# Patient Record
Sex: Male | Born: 1965 | Race: White | Hispanic: No | Marital: Single | Smoking: Current every day smoker
Health system: Southern US, Community
[De-identification: ages and names within clinical notes are randomized; demographics above are authoritative.]

## PROBLEM LIST (undated history)

## (undated) DIAGNOSIS — Z87442 Personal history of urinary calculi: Secondary | ICD-10-CM

## (undated) DIAGNOSIS — K759 Inflammatory liver disease, unspecified: Secondary | ICD-10-CM

## (undated) HISTORY — PX: APPENDECTOMY: SHX54

## (undated) HISTORY — PX: KNEE SURGERY: SHX244

---

## 2008-11-04 ENCOUNTER — Emergency Department: Payer: Self-pay | Admitting: Unknown Physician Specialty

## 2009-01-26 ENCOUNTER — Emergency Department: Payer: Self-pay | Admitting: Emergency Medicine

## 2009-08-03 ENCOUNTER — Emergency Department: Payer: Self-pay | Admitting: Emergency Medicine

## 2009-11-08 ENCOUNTER — Emergency Department: Payer: Self-pay | Admitting: Unknown Physician Specialty

## 2010-02-04 ENCOUNTER — Emergency Department: Payer: Self-pay | Admitting: Emergency Medicine

## 2010-02-14 ENCOUNTER — Emergency Department: Payer: Self-pay | Admitting: Emergency Medicine

## 2010-06-14 ENCOUNTER — Emergency Department: Payer: Self-pay | Admitting: Emergency Medicine

## 2010-08-06 ENCOUNTER — Emergency Department: Payer: Self-pay | Admitting: Emergency Medicine

## 2010-08-19 ENCOUNTER — Emergency Department: Payer: Self-pay | Admitting: Emergency Medicine

## 2010-11-15 ENCOUNTER — Emergency Department: Payer: Self-pay | Admitting: Emergency Medicine

## 2010-11-19 ENCOUNTER — Emergency Department: Payer: Self-pay | Admitting: Emergency Medicine

## 2010-11-25 ENCOUNTER — Emergency Department: Payer: Self-pay | Admitting: Emergency Medicine

## 2010-11-26 ENCOUNTER — Emergency Department: Payer: Self-pay | Admitting: Unknown Physician Specialty

## 2011-01-15 ENCOUNTER — Emergency Department: Payer: Self-pay | Admitting: Emergency Medicine

## 2011-03-29 ENCOUNTER — Emergency Department: Payer: Self-pay | Admitting: *Deleted

## 2011-05-20 ENCOUNTER — Emergency Department: Payer: Self-pay | Admitting: Internal Medicine

## 2011-06-23 ENCOUNTER — Emergency Department: Payer: Self-pay | Admitting: Emergency Medicine

## 2011-06-23 LAB — URINALYSIS, COMPLETE
Bacteria: NONE SEEN
Blood: NEGATIVE
Leukocyte Esterase: NEGATIVE
Nitrite: NEGATIVE
Ph: 5 (ref 4.5–8.0)
Protein: NEGATIVE
Specific Gravity: 1.02 (ref 1.003–1.030)
WBC UR: <1 /HPF (ref 0–5)

## 2011-10-19 ENCOUNTER — Emergency Department: Payer: Self-pay | Admitting: Emergency Medicine

## 2011-11-24 ENCOUNTER — Emergency Department: Payer: Self-pay | Admitting: Emergency Medicine

## 2011-11-25 LAB — CBC
HCT: 47.9 % (ref 40.0–52.0)
HGB: 15.6 g/dL (ref 13.0–18.0)
MCH: 31.9 pg (ref 26.0–34.0)
MCHC: 32.5 g/dL (ref 32.0–36.0)
MCV: 98 fL (ref 80–100)
Platelet: 173 10*3/uL (ref 150–440)
RBC: 4.88 10*6/uL (ref 4.40–5.90)

## 2011-11-25 LAB — URINALYSIS, COMPLETE
Bacteria: NONE SEEN
Bilirubin,UR: NEGATIVE
Glucose,UR: NEGATIVE mg/dL (ref 0–75)
Leukocyte Esterase: NEGATIVE
Nitrite: NEGATIVE
Protein: NEGATIVE
Specific Gravity: 1.027 (ref 1.003–1.030)
WBC UR: 1 /HPF (ref 0–5)

## 2011-11-25 LAB — COMPREHENSIVE METABOLIC PANEL
Albumin: 4 g/dL (ref 3.4–5.0)
Anion Gap: 7 (ref 7–16)
BUN: 20 mg/dL — ABNORMAL HIGH (ref 7–18)
Bilirubin,Total: 0.4 mg/dL (ref 0.2–1.0)
Calcium, Total: 9.3 mg/dL (ref 8.5–10.1)
Chloride: 105 mmol/L (ref 98–107)
Co2: 27 mmol/L (ref 21–32)
Glucose: 98 mg/dL (ref 65–99)
Osmolality: 280 (ref 275–301)
Potassium: 4.5 mmol/L (ref 3.5–5.1)
SGOT(AST): 94 U/L — ABNORMAL HIGH (ref 15–37)
SGPT (ALT): 150 U/L — ABNORMAL HIGH
Sodium: 139 mmol/L (ref 136–145)

## 2011-11-25 LAB — LIPASE, BLOOD: Lipase: 146 U/L (ref 73–393)

## 2011-12-15 ENCOUNTER — Emergency Department: Payer: Self-pay | Admitting: Emergency Medicine

## 2011-12-27 ENCOUNTER — Emergency Department: Payer: Self-pay | Admitting: *Deleted

## 2012-01-10 ENCOUNTER — Emergency Department: Payer: Self-pay | Admitting: Emergency Medicine

## 2012-01-20 LAB — URINALYSIS, COMPLETE
Bilirubin,UR: NEGATIVE
Blood: NEGATIVE
Glucose,UR: NEGATIVE mg/dL (ref 0–75)
Hyaline Cast: 74
Ketone: NEGATIVE
Leukocyte Esterase: NEGATIVE
Ph: 5 (ref 4.5–8.0)
Protein: 30
Specific Gravity: 1.025 (ref 1.003–1.030)
Squamous Epithelial: 2

## 2012-01-20 LAB — COMPREHENSIVE METABOLIC PANEL
Albumin: 3.9 g/dL (ref 3.4–5.0)
Alkaline Phosphatase: 103 U/L (ref 50–136)
Anion Gap: 13 (ref 7–16)
BUN: 37 mg/dL — ABNORMAL HIGH (ref 7–18)
Bilirubin,Total: 0.4 mg/dL (ref 0.2–1.0)
Calcium, Total: 8.9 mg/dL (ref 8.5–10.1)
Chloride: 104 mmol/L (ref 98–107)
Creatinine: 2.49 mg/dL — ABNORMAL HIGH (ref 0.60–1.30)
Glucose: 167 mg/dL — ABNORMAL HIGH (ref 65–99)
Potassium: 4 mmol/L (ref 3.5–5.1)
SGOT(AST): 57 U/L — ABNORMAL HIGH (ref 15–37)
Sodium: 136 mmol/L (ref 136–145)
Total Protein: 8.8 g/dL — ABNORMAL HIGH (ref 6.4–8.2)

## 2012-01-20 LAB — CBC
HCT: 45.3 % (ref 40.0–52.0)
HGB: 14.6 g/dL (ref 13.0–18.0)
MCHC: 32.2 g/dL (ref 32.0–36.0)
Platelet: 194 10*3/uL (ref 150–440)
RDW: 14.4 % (ref 11.5–14.5)
WBC: 14.3 10*3/uL — ABNORMAL HIGH (ref 3.8–10.6)

## 2012-01-20 LAB — TROPONIN I: Troponin-I: <0.02 ng/mL

## 2012-01-21 ENCOUNTER — Inpatient Hospital Stay: Payer: Self-pay | Admitting: Specialist

## 2012-01-21 LAB — BASIC METABOLIC PANEL
Anion Gap: 19 — ABNORMAL HIGH (ref 7–16)
BUN: 37 mg/dL — ABNORMAL HIGH (ref 7–18)
Calcium, Total: 9.3 mg/dL (ref 8.5–10.1)
Chloride: 104 mmol/L (ref 98–107)
Co2: 15 mmol/L — ABNORMAL LOW (ref 21–32)
Creatinine: 2.57 mg/dL — ABNORMAL HIGH (ref 0.60–1.30)
EGFR (African American): 33 — ABNORMAL LOW
EGFR (Non-African Amer.): 28 — ABNORMAL LOW
Glucose: 136 mg/dL — ABNORMAL HIGH (ref 65–99)
Osmolality: 286 (ref 275–301)
Potassium: 4 mmol/L (ref 3.5–5.1)
Sodium: 138 mmol/L (ref 136–145)

## 2012-01-21 LAB — ACETAMINOPHEN LEVEL: Acetaminophen: <2 ug/mL

## 2012-01-21 LAB — SALICYLATE LEVEL: Salicylates, Serum: 9.9 mg/dL — ABNORMAL HIGH

## 2012-01-21 LAB — TROPONIN I
Troponin-I: <0.02 ng/mL
Troponin-I: <0.02 ng/mL

## 2012-01-21 LAB — TSH: Thyroid Stimulating Horm: 0.788 u[IU]/mL

## 2012-01-22 LAB — CBC WITH DIFFERENTIAL/PLATELET
Basophil #: 0 10*3/uL (ref 0.0–0.1)
Basophil %: 0.5 %
Eosinophil #: 0.2 10*3/uL (ref 0.0–0.7)
HCT: 37.6 % — ABNORMAL LOW (ref 40.0–52.0)
HGB: 12.6 g/dL — ABNORMAL LOW (ref 13.0–18.0)
Lymphocyte #: 3.2 10*3/uL (ref 1.0–3.6)
Lymphocyte %: 43.4 %
MCHC: 33.6 g/dL (ref 32.0–36.0)
MCV: 97 fL (ref 80–100)
Monocyte #: 0.5 x10 3/mm (ref 0.2–1.0)
Monocyte %: 7.1 %
Neutrophil #: 3.4 10*3/uL (ref 1.4–6.5)
RDW: 14 % (ref 11.5–14.5)

## 2012-01-22 LAB — COMPREHENSIVE METABOLIC PANEL
Anion Gap: 5 — ABNORMAL LOW (ref 7–16)
Bilirubin,Total: 0.2 mg/dL (ref 0.2–1.0)
Calcium, Total: 7.9 mg/dL — ABNORMAL LOW (ref 8.5–10.1)
Chloride: 113 mmol/L — ABNORMAL HIGH (ref 98–107)
Co2: 26 mmol/L (ref 21–32)
Creatinine: 0.91 mg/dL (ref 0.60–1.30)
EGFR (African American): >60
EGFR (Non-African Amer.): >60
Glucose: 98 mg/dL (ref 65–99)
Potassium: 4.5 mmol/L (ref 3.5–5.1)
SGOT(AST): 53 U/L — ABNORMAL HIGH (ref 15–37)
SGPT (ALT): 62 U/L (ref 12–78)
Sodium: 144 mmol/L (ref 136–145)
Total Protein: 5.9 g/dL — ABNORMAL LOW (ref 6.4–8.2)

## 2012-05-05 ENCOUNTER — Emergency Department: Payer: Self-pay | Admitting: Emergency Medicine

## 2012-06-25 ENCOUNTER — Emergency Department: Payer: Self-pay | Admitting: Emergency Medicine

## 2012-06-25 LAB — BASIC METABOLIC PANEL
Anion Gap: 7 (ref 7–16)
BUN: 14 mg/dL (ref 7–18)
Calcium, Total: 9 mg/dL (ref 8.5–10.1)
Co2: 25 mmol/L (ref 21–32)
Creatinine: 1.07 mg/dL (ref 0.60–1.30)
EGFR (African American): >60
Glucose: 165 mg/dL — ABNORMAL HIGH (ref 65–99)
Sodium: 135 mmol/L — ABNORMAL LOW (ref 136–145)

## 2012-06-25 LAB — URINALYSIS, COMPLETE
Bacteria: NONE SEEN
Blood: NEGATIVE
Ketone: NEGATIVE
Leukocyte Esterase: NEGATIVE
RBC,UR: 1 /HPF (ref 0–5)
Specific Gravity: 1.01 (ref 1.003–1.030)

## 2012-06-25 LAB — CBC
MCH: 32.4 pg (ref 26.0–34.0)
RBC: 4.86 10*6/uL (ref 4.40–5.90)
RDW: 13.6 % (ref 11.5–14.5)

## 2012-08-26 ENCOUNTER — Emergency Department: Payer: Self-pay | Admitting: Emergency Medicine

## 2012-08-28 ENCOUNTER — Emergency Department: Payer: Self-pay | Admitting: Emergency Medicine

## 2012-11-20 ENCOUNTER — Emergency Department: Payer: Self-pay | Admitting: Emergency Medicine

## 2012-11-20 LAB — COMPREHENSIVE METABOLIC PANEL
Anion Gap: 8 (ref 7–16)
BUN: 18 mg/dL (ref 7–18)
Calcium, Total: 9.2 mg/dL (ref 8.5–10.1)
Chloride: 105 mmol/L (ref 98–107)
Co2: 25 mmol/L (ref 21–32)
Creatinine: 1.07 mg/dL (ref 0.60–1.30)
EGFR (African American): >60
Glucose: 103 mg/dL — ABNORMAL HIGH (ref 65–99)
Osmolality: 278 (ref 275–301)
Potassium: 3.5 mmol/L (ref 3.5–5.1)
SGOT(AST): 67 U/L — ABNORMAL HIGH (ref 15–37)
SGPT (ALT): 98 U/L — ABNORMAL HIGH (ref 12–78)

## 2012-11-20 LAB — CBC
HCT: 46.9 % (ref 40.0–52.0)
MCH: 32.1 pg (ref 26.0–34.0)
MCHC: 33.8 g/dL (ref 32.0–36.0)
MCV: 95 fL (ref 80–100)
RBC: 4.94 10*6/uL (ref 4.40–5.90)
RDW: 14.1 % (ref 11.5–14.5)
WBC: 9.3 10*3/uL (ref 3.8–10.6)

## 2012-11-21 LAB — CK TOTAL AND CKMB (NOT AT ARMC): CK, Total: 74 U/L (ref 35–232)

## 2012-12-15 ENCOUNTER — Emergency Department: Payer: Self-pay | Admitting: Emergency Medicine

## 2012-12-20 ENCOUNTER — Emergency Department: Payer: Self-pay | Admitting: Emergency Medicine

## 2012-12-20 LAB — BASIC METABOLIC PANEL
Anion Gap: 5 — ABNORMAL LOW (ref 7–16)
Calcium, Total: 9.2 mg/dL (ref 8.5–10.1)
Creatinine: 0.89 mg/dL (ref 0.60–1.30)
EGFR (Non-African Amer.): >60
Glucose: 93 mg/dL (ref 65–99)
Osmolality: 279 (ref 275–301)
Potassium: 3.9 mmol/L (ref 3.5–5.1)
Sodium: 140 mmol/L (ref 136–145)

## 2012-12-20 LAB — URINALYSIS, COMPLETE
Bacteria: NONE SEEN
Ketone: NEGATIVE
Ph: 5 (ref 4.5–8.0)
RBC,UR: NONE SEEN /HPF (ref 0–5)

## 2012-12-20 LAB — CBC
MCV: 96 fL (ref 80–100)
RDW: 14.1 % (ref 11.5–14.5)
WBC: 6.6 10*3/uL (ref 3.8–10.6)

## 2014-02-22 ENCOUNTER — Emergency Department: Payer: Self-pay | Admitting: Emergency Medicine

## 2014-02-22 LAB — CBC WITH DIFFERENTIAL/PLATELET
BASOS ABS: 0 10*3/uL (ref 0.0–0.1)
BASOS PCT: 0.6 %
Eosinophil #: 0.2 10*3/uL (ref 0.0–0.7)
Eosinophil %: 4.2 %
HCT: 46.8 % (ref 40.0–52.0)
HGB: 15.1 g/dL (ref 13.0–18.0)
Lymphocyte #: 2.7 10*3/uL (ref 1.0–3.6)
Lymphocyte %: 45.5 %
MCH: 31.6 pg (ref 26.0–34.0)
MCHC: 32.3 g/dL (ref 32.0–36.0)
MCV: 98 fL (ref 80–100)
MONOS PCT: 8.2 %
Monocyte #: 0.5 x10 3/mm (ref 0.2–1.0)
Neutrophil #: 2.4 10*3/uL (ref 1.4–6.5)
Neutrophil %: 41.5 %
Platelet: 120 10*3/uL — ABNORMAL LOW (ref 150–440)
RBC: 4.79 10*6/uL (ref 4.40–5.90)
RDW: 13.2 % (ref 11.5–14.5)
WBC: 5.9 10*3/uL (ref 3.8–10.6)

## 2014-02-22 LAB — COMPREHENSIVE METABOLIC PANEL
ALBUMIN: 3.6 g/dL (ref 3.4–5.0)
ALK PHOS: 109 U/L
ALT: 164 U/L — AB
ANION GAP: 6 — AB (ref 7–16)
BUN: 16 mg/dL (ref 7–18)
Bilirubin,Total: 0.3 mg/dL (ref 0.2–1.0)
CO2: 27 mmol/L (ref 21–32)
Calcium, Total: 8.6 mg/dL (ref 8.5–10.1)
Chloride: 108 mmol/L — ABNORMAL HIGH (ref 98–107)
Creatinine: 0.9 mg/dL (ref 0.60–1.30)
GLUCOSE: 77 mg/dL (ref 65–99)
Osmolality: 281 (ref 275–301)
POTASSIUM: 3.9 mmol/L (ref 3.5–5.1)
SGOT(AST): 88 U/L — ABNORMAL HIGH (ref 15–37)
SODIUM: 141 mmol/L (ref 136–145)
TOTAL PROTEIN: 8.2 g/dL (ref 6.4–8.2)

## 2014-02-22 LAB — URINALYSIS, COMPLETE
BACTERIA: NONE SEEN
BILIRUBIN, UR: NEGATIVE
Blood: NEGATIVE
GLUCOSE, UR: NEGATIVE mg/dL (ref 0–75)
KETONE: NEGATIVE
LEUKOCYTE ESTERASE: NEGATIVE
Nitrite: NEGATIVE
PH: 6 (ref 4.5–8.0)
Protein: NEGATIVE
RBC,UR: 1 /HPF (ref 0–5)
SQUAMOUS EPITHELIAL: NONE SEEN
Specific Gravity: 1.004 (ref 1.003–1.030)
WBC UR: <1 /HPF (ref 0–5)

## 2014-02-22 LAB — LIPASE, BLOOD: Lipase: 190 U/L (ref 73–393)

## 2014-04-03 ENCOUNTER — Ambulatory Visit: Payer: Self-pay | Admitting: Gastroenterology

## 2014-04-30 IMAGING — CR RIGHT FOREARM - 2 VIEW
1 series · 2 of 2 positions shown · non-contrast
Comparison: none

REASON FOR EXAM: pain/edema 2nd to fall
COMMENTS:   May transport without cardiac monitor

PROCEDURE:     DXR - DXR FOREARM RIGHT  - October 19, 2011  [DATE]
RESULT:     No fracture, dislocation or other acute bony abnormality is
identified.

[Series 2: x forearm lat right · 0.14mm/px · 2 of 2 slices shown]
[im 1/2]
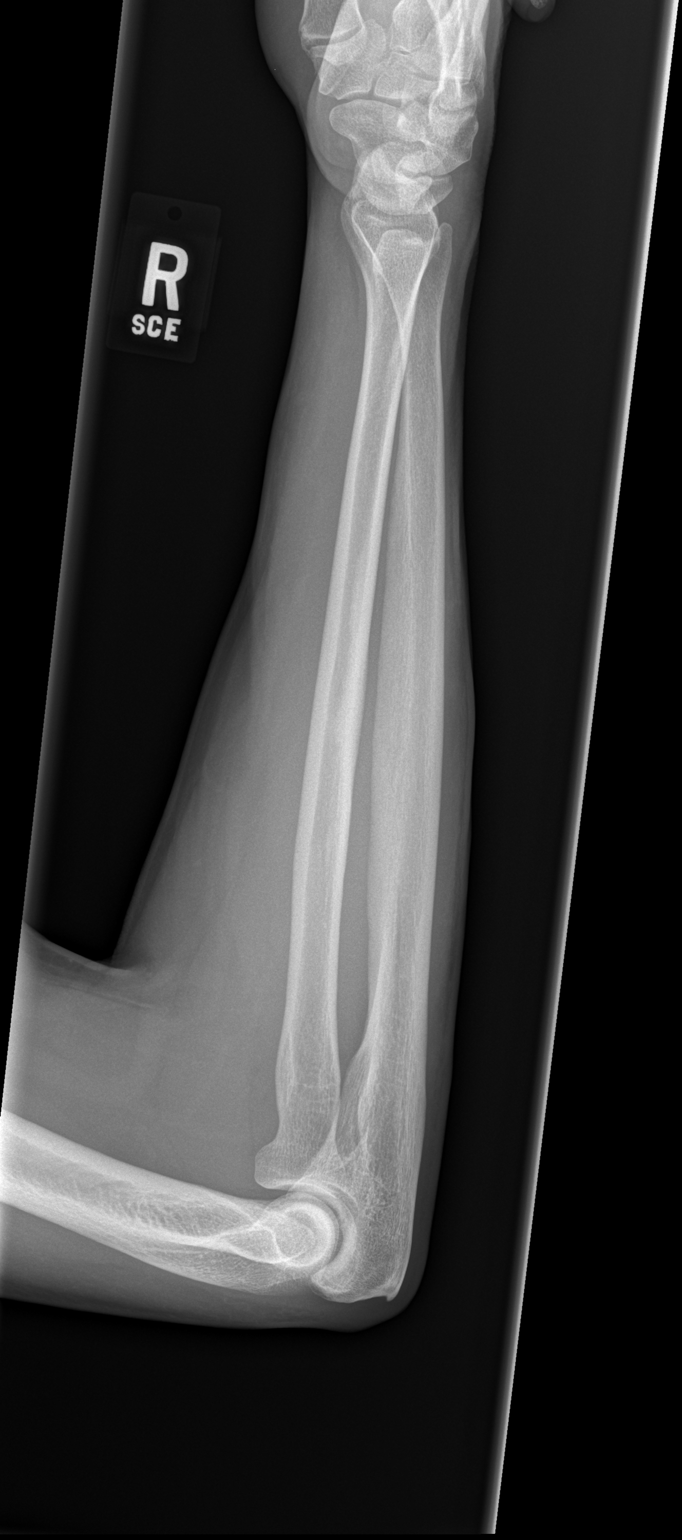
[im 2/2]
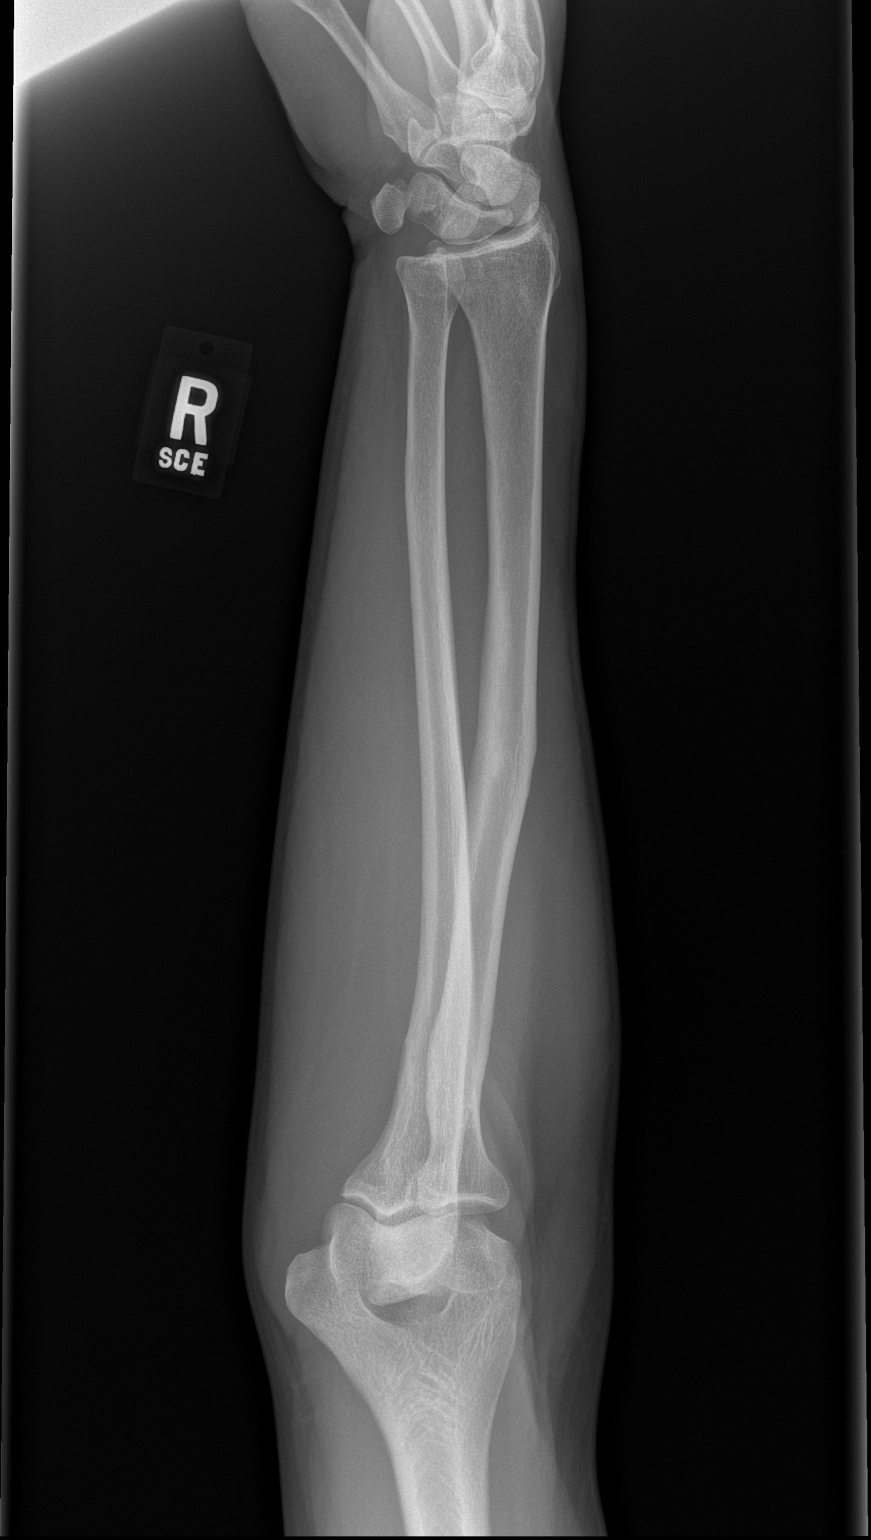

[2 of 2 positions shown; findings below may reference images not displayed]

IMPRESSION: 1.  No significant osseous abnormalities are identified.
2.  There is no elevation of the distal humeral fat pads.

[REDACTED]

## 2014-05-25 ENCOUNTER — Emergency Department: Payer: Self-pay | Admitting: Emergency Medicine

## 2014-05-25 LAB — URINALYSIS, COMPLETE
Bacteria: NONE SEEN
Bilirubin,UR: NEGATIVE
Blood: NEGATIVE
GLUCOSE, UR: NEGATIVE mg/dL (ref 0–75)
Hyaline Cast: 5
Ketone: NEGATIVE
Leukocyte Esterase: NEGATIVE
NITRITE: NEGATIVE
Ph: 5 (ref 4.5–8.0)
Protein: NEGATIVE
SPECIFIC GRAVITY: 1.02 (ref 1.003–1.030)
Squamous Epithelial: NONE SEEN

## 2014-05-25 LAB — CBC
HCT: 49.3 % (ref 40.0–52.0)
HGB: 16.3 g/dL (ref 13.0–18.0)
MCH: 32.3 pg (ref 26.0–34.0)
MCHC: 33.1 g/dL (ref 32.0–36.0)
MCV: 98 fL (ref 80–100)
Platelet: 167 10*3/uL (ref 150–440)
RBC: 5.05 10*6/uL (ref 4.40–5.90)
RDW: 13.7 % (ref 11.5–14.5)
WBC: 13.5 10*3/uL — ABNORMAL HIGH (ref 3.8–10.6)

## 2014-05-25 LAB — COMPREHENSIVE METABOLIC PANEL
Albumin: 3.7 g/dL (ref 3.4–5.0)
Alkaline Phosphatase: 77 U/L
Anion Gap: 9 (ref 7–16)
BILIRUBIN TOTAL: 0.5 mg/dL (ref 0.2–1.0)
BUN: 15 mg/dL (ref 7–18)
CALCIUM: 9.3 mg/dL (ref 8.5–10.1)
Chloride: 102 mmol/L (ref 98–107)
Co2: 29 mmol/L (ref 21–32)
Creatinine: 1.1 mg/dL (ref 0.60–1.30)
GLUCOSE: 89 mg/dL (ref 65–99)
OSMOLALITY: 280 (ref 275–301)
Potassium: 4.1 mmol/L (ref 3.5–5.1)
SGOT(AST): 40 U/L — ABNORMAL HIGH (ref 15–37)
SGPT (ALT): 44 U/L
SODIUM: 140 mmol/L (ref 136–145)
Total Protein: 8.6 g/dL — ABNORMAL HIGH (ref 6.4–8.2)

## 2014-05-25 LAB — LIPASE, BLOOD: Lipase: 77 U/L (ref 73–393)

## 2014-06-06 IMAGING — CT CT ABD-PELV W/O CM
1 of 2 series · 15 of 32 positions shown, 19 images · non-contrast
Comparison: 08/30/2010

REASON FOR EXAM: (1) RLQ/ right flank pain, nausea,; (2) RLQ/ right flank
pain, nausea,
COMMENTS:

PROCEDURE:     CT  - CT ABDOMEN AND PELVIS W[DATE]  [DATE]
RESULT:     Indication: Flank Pain
TECHNIQUE: Multiple axial images from the lung bases to the symphysis pubis
were obtained without oral and without intravenous contrast.

[Series 2: 3mm soft tissue · axial · 0.70mm/px · z∈[-198,+228]mm · 15 of 156 slices shown, 19 images]
[im 7/156  soft-tissue]
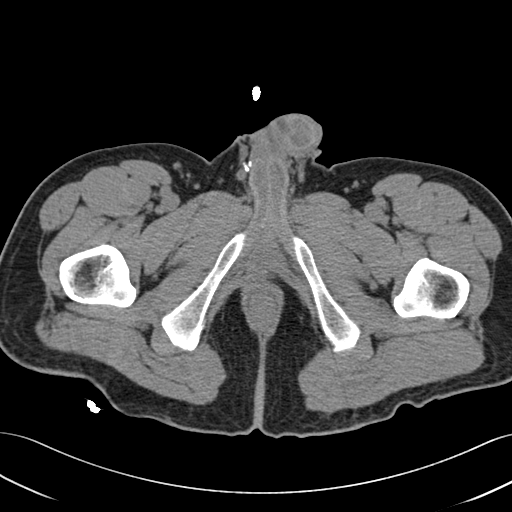
[im 7/156  bone]
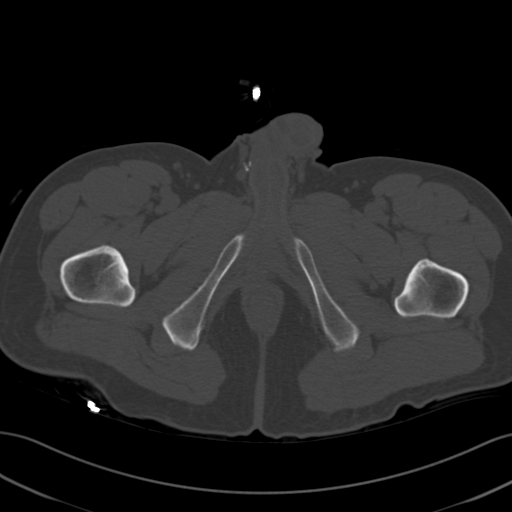
[im 19/156  soft-tissue]
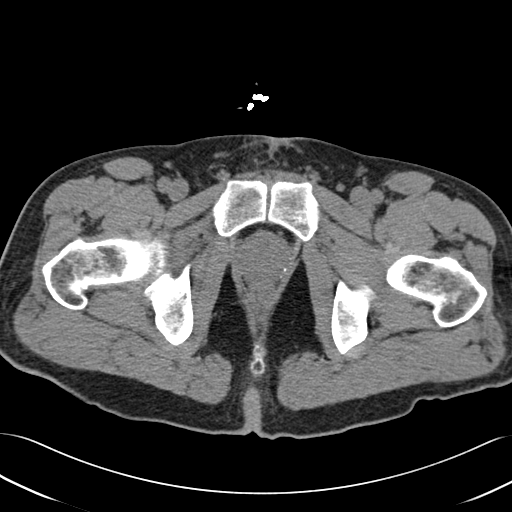
[im 32/156  soft-tissue]
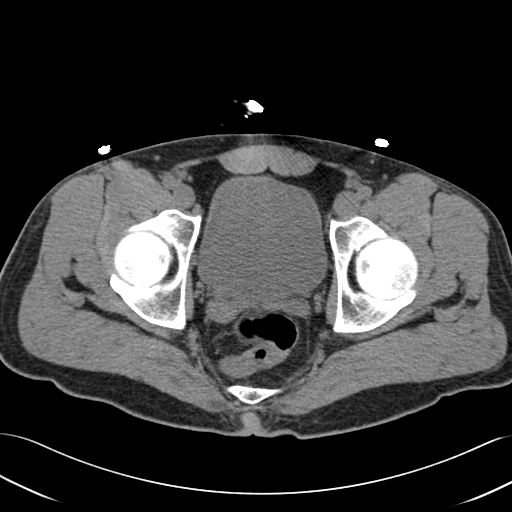
[im 44/156  soft-tissue]
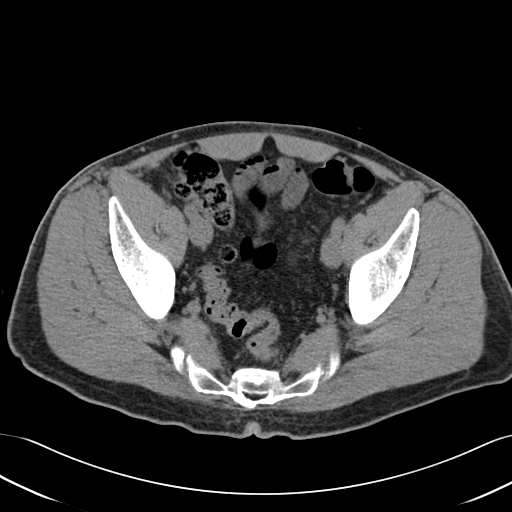
[im 56/156  soft-tissue]
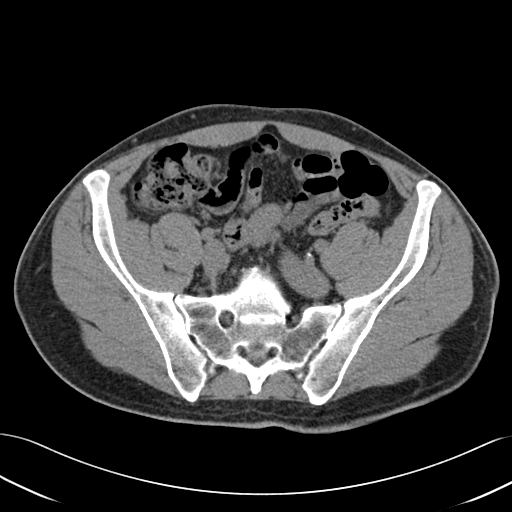
[im 69/156  soft-tissue]
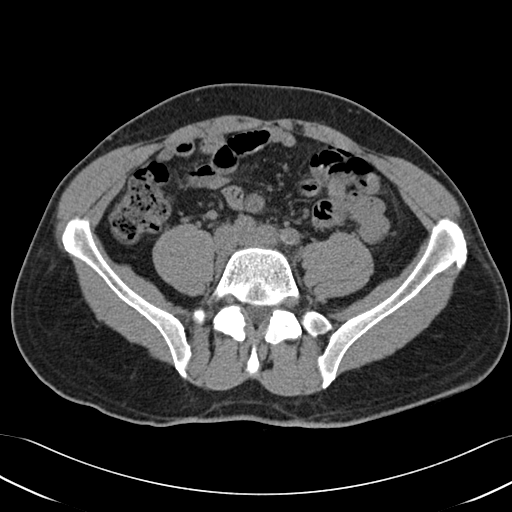
[im 81/156  soft-tissue]
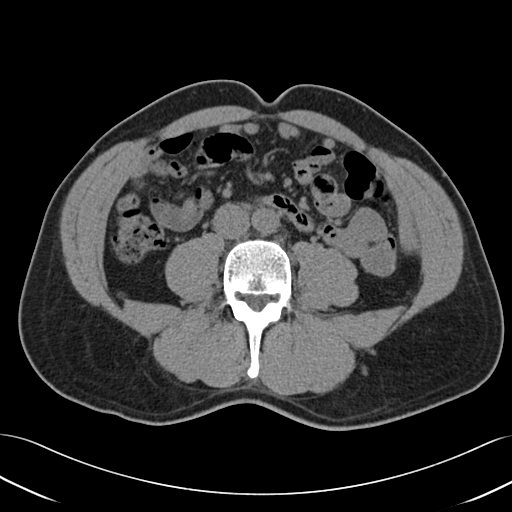
[im 87/156  soft-tissue]
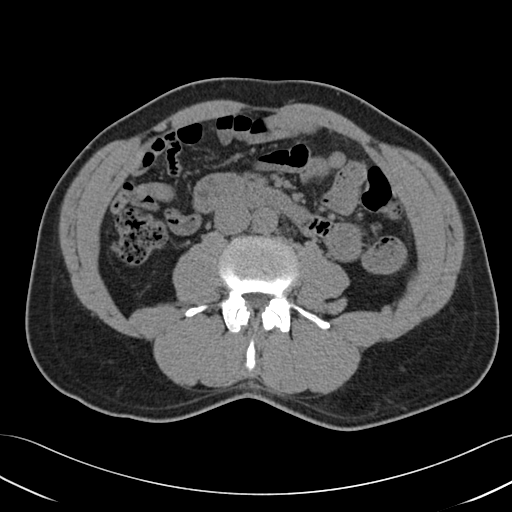
[im 100/156  soft-tissue]
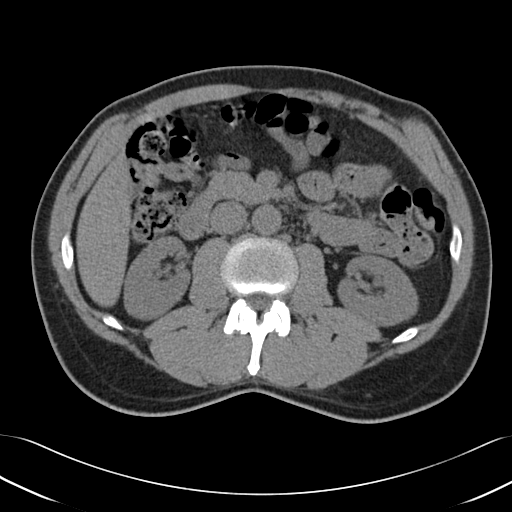
[im 100/156  bone]
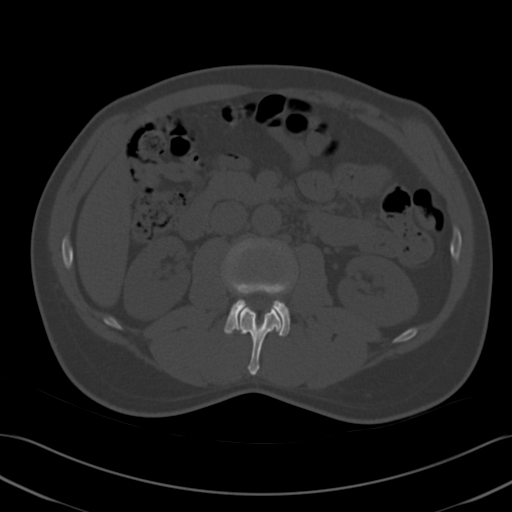
[im 112/156  soft-tissue]
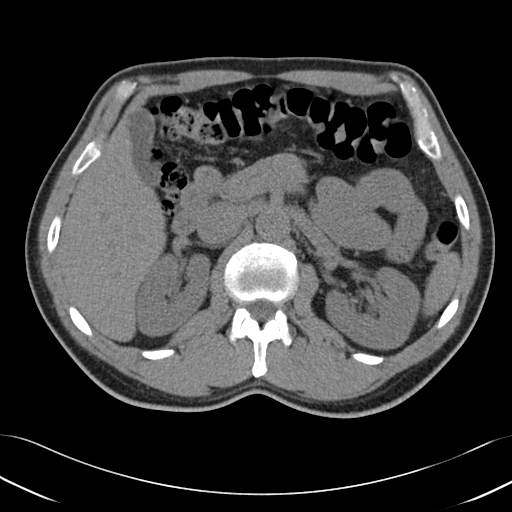
[im 125/156  soft-tissue]
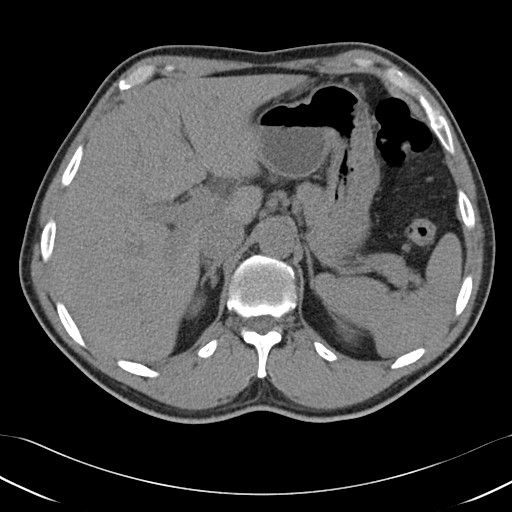
[im 131/156  lung]
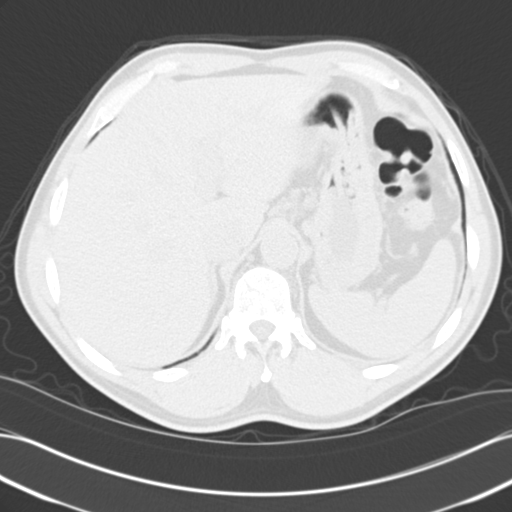
[im 137/156  soft-tissue]
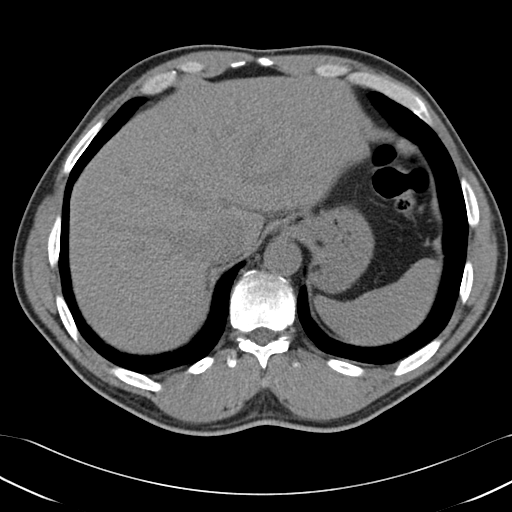
[im 137/156  lung]
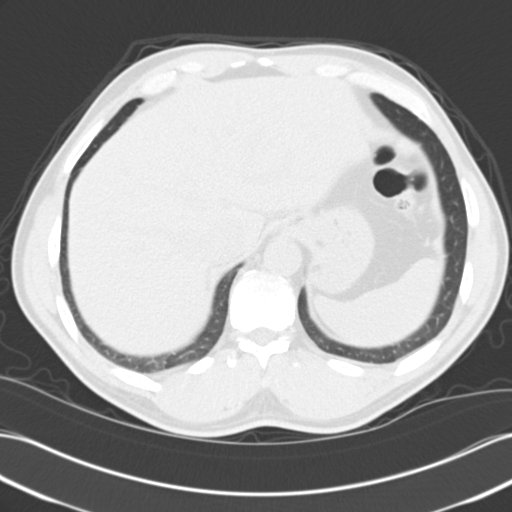
[im 143/156  lung]
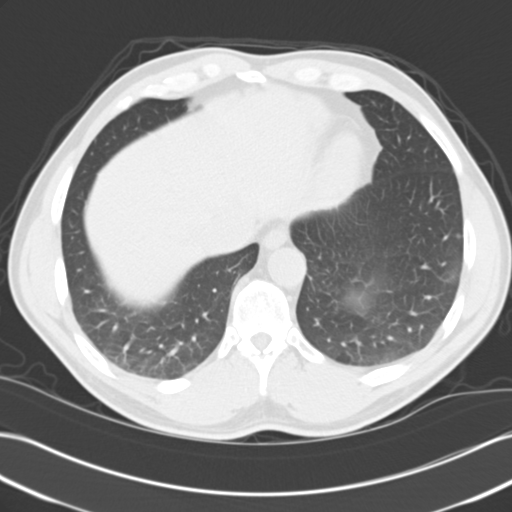
[im 149/156  soft-tissue]
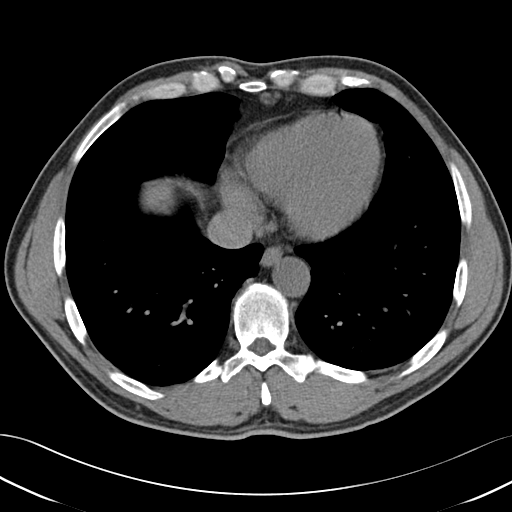
[im 149/156  lung]
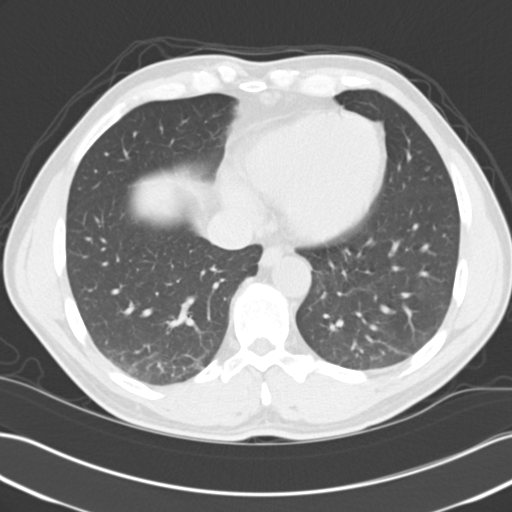

[15 of 32 positions shown; findings below may reference images not displayed]

FINDINGS: The lung bases are clear. There is no pleural or pericardial effusions.

No renal, ureteral, or bladder calculi. No obstructive uropathy. No
perinephric stranding is seen. There are left parapelvic cysts. The bladder
is unremarkable.

The liver demonstrates no focal abnormality. The gallbladder is
unremarkable. The spleen demonstrates no focal abnormality. The adrenal
glands and pancreas are normal.

The unopacified stomach, duodenum, small intestine, and large intestine are
unremarkable, but evaluation is limited by lack of oral contrast. There is
diverticulosis without evidence of diverticulitis. No normal nor abnormal
appendix is visualized. There is no right lower quadrant free fluid. There
is no pericecal inflammatory change. There is no pneumoperitoneum,
pneumatosis, or portal venous gas. There is no abdominal or pelvic free
fluid. There is no lymphadenopathy.

The abdominal aorta is normal in caliber .

The osseous structures are unremarkable.
IMPRESSION: 1. No urolithiasis or obstructive uropathy.

2. No normal nor abnormal appendix is visualized. There are no secondary
signs of acute appendicitis.

[REDACTED]

## 2014-06-13 ENCOUNTER — Ambulatory Visit: Admit: 2014-06-13 | Payer: Self-pay | Admitting: Gastroenterology

## 2014-08-02 IMAGING — US US RENAL KIDNEY
1 series · 14 of 25 positions shown · non-contrast
Comparison: none

REASON FOR EXAM: Acute Renal Failure.
COMMENTS:

[Series 1: us renal kidney · 0.28mm/px · 14 of 56 slices shown]
[im 1/56]
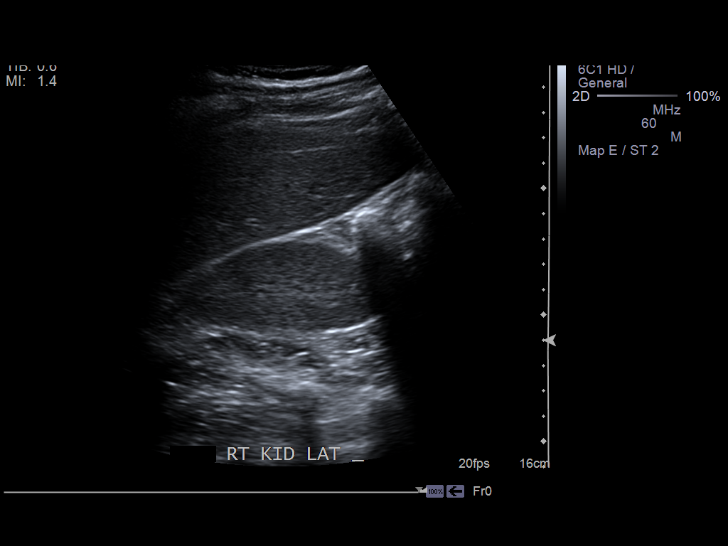
[im 5/56]
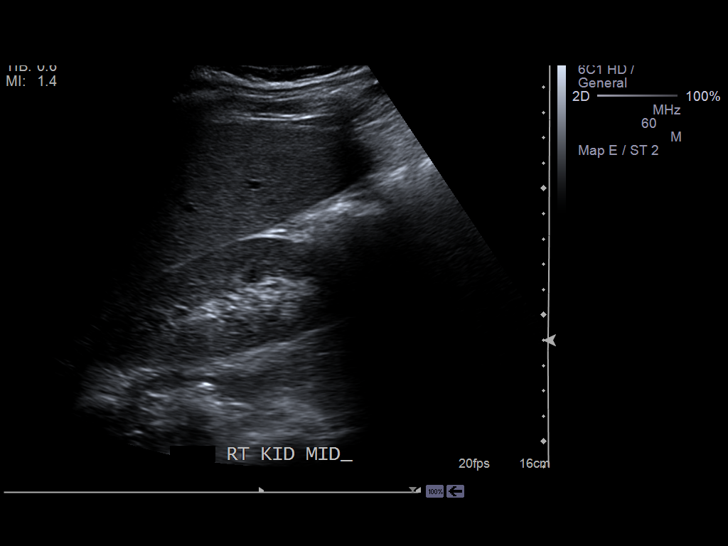
[im 10/56]
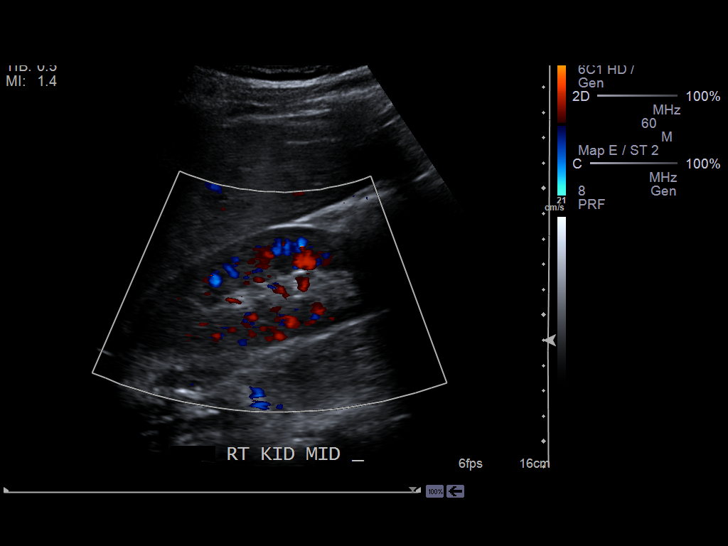
[im 14/56]
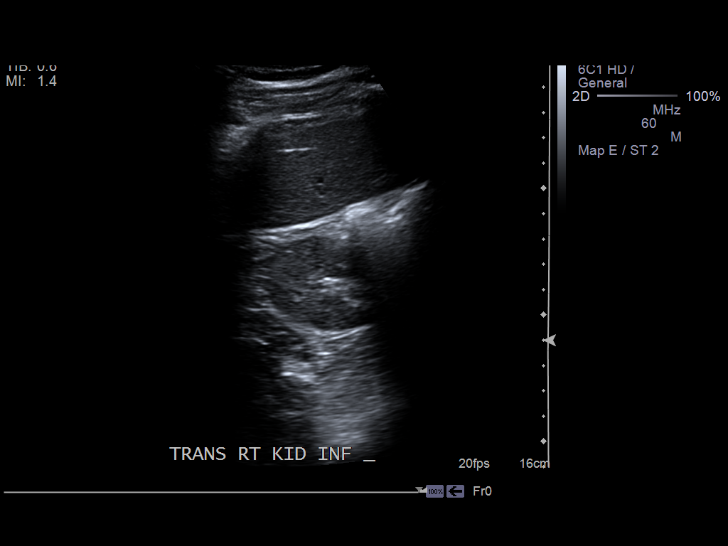
[im 19/56]
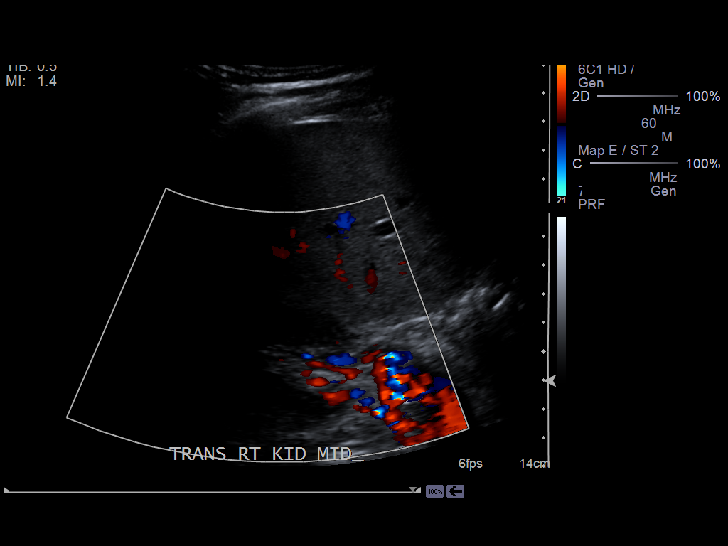
[im 21/56]
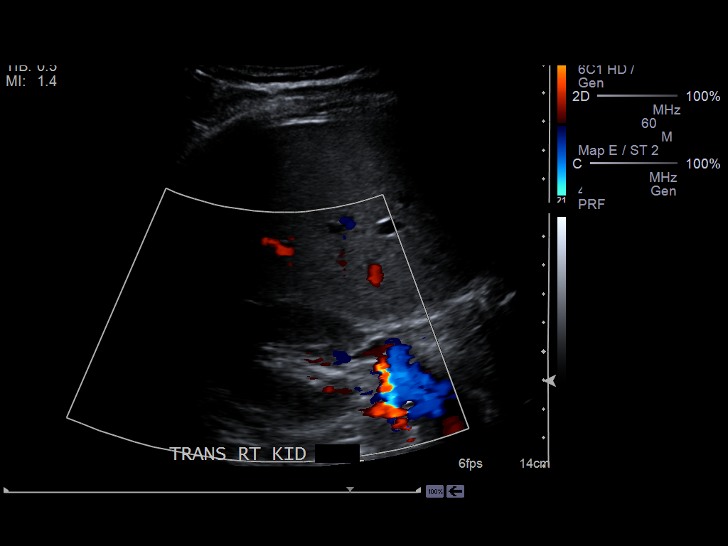
[im 26/56]
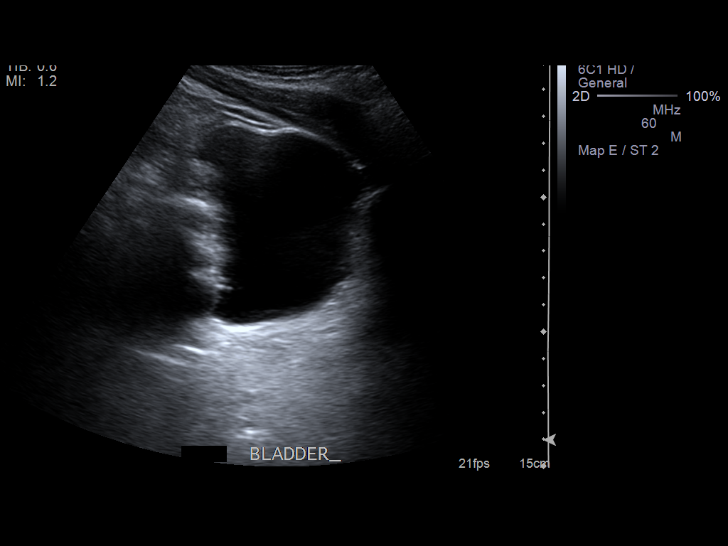
[im 30/56]
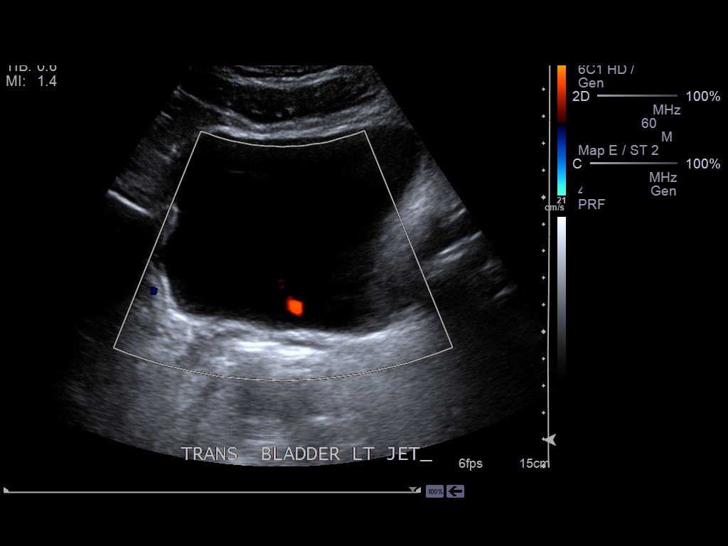
[im 35/56]
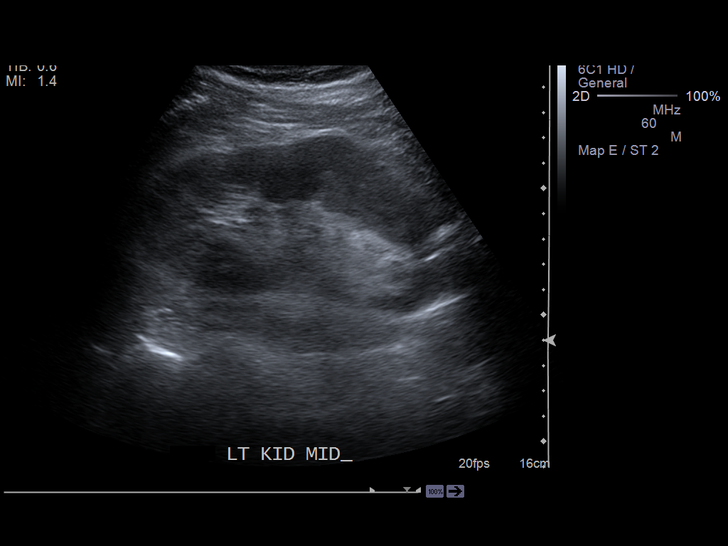
[im 37/56]
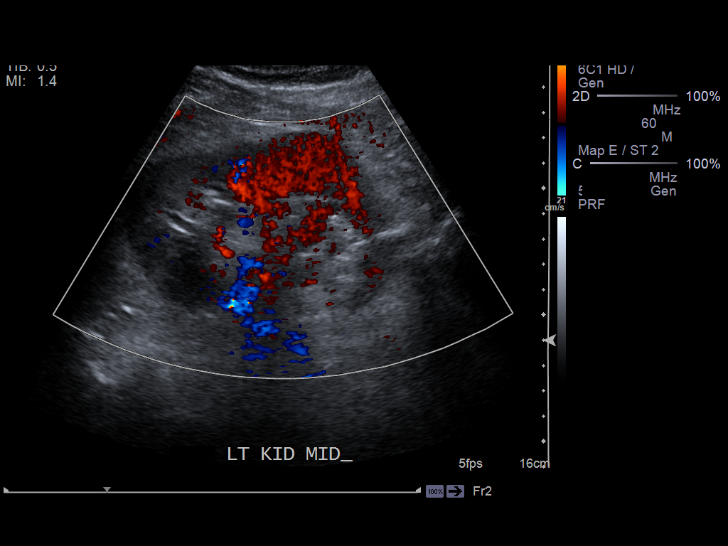
[im 42/56]
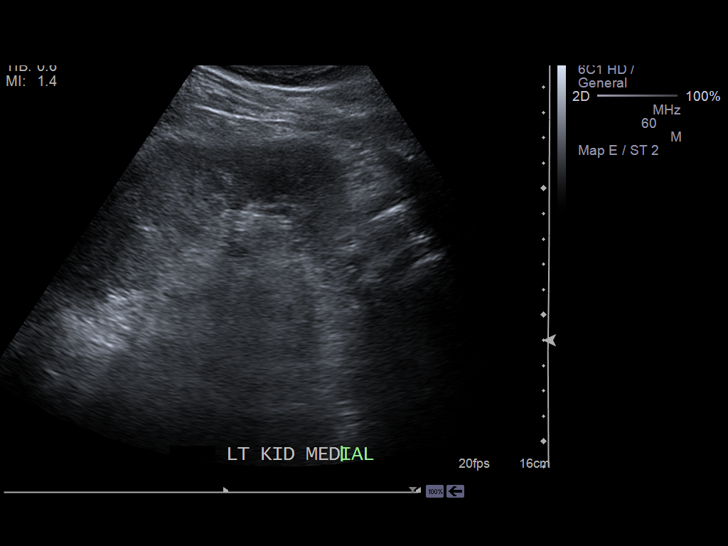
[im 46/56]
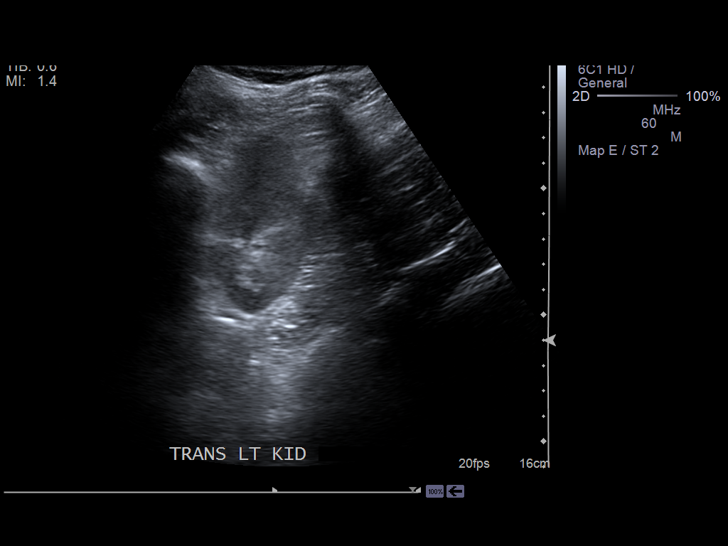
[im 51/56]
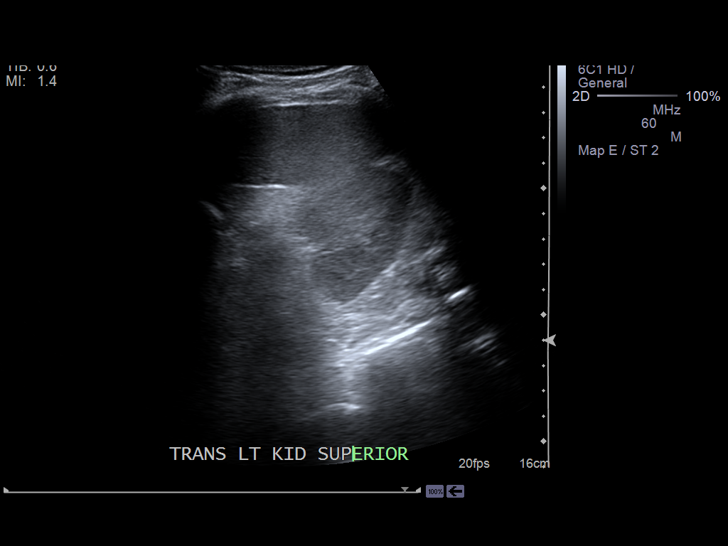
[im 56/56]
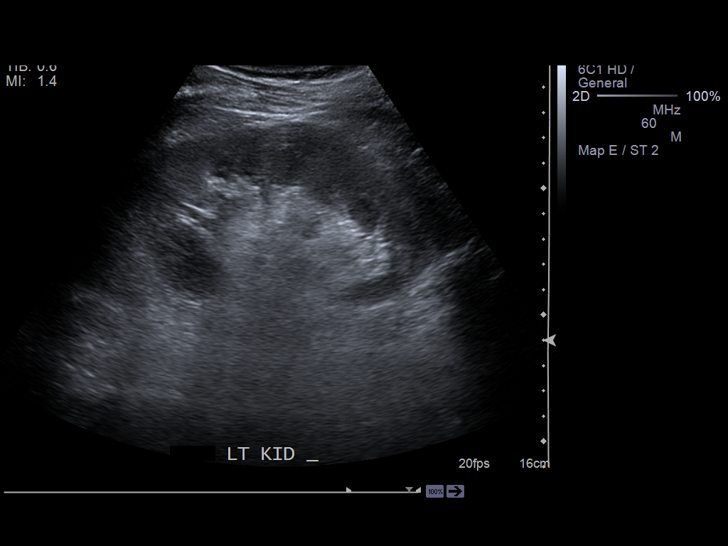

[14 of 25 positions shown; findings below may reference images not displayed]

PROCEDURE:     US  - US KIDNEY  - January 21, 2012  [DATE]

RESULT:     Comparison: None

Technique and findings: Multiple gray-scale and color doppler images of the
kidneys were obtained.

The right kidney measures 11.9 x 3.9 x 5.8 cm and the left kidney measures
11.7 x 5.7 x 5.5 cm. The kidneys are normal in echogenicity. There is no
hydronephrosis.  There are no echogenic foci.  There are no renal masses.
There is no free fluid in the region of the renal fossa.
IMPRESSION: Normal renal ultrasound.

[REDACTED]

## 2014-08-06 ENCOUNTER — Emergency Department: Payer: Self-pay | Admitting: Emergency Medicine

## 2014-08-30 ENCOUNTER — Emergency Department
Admit: 2014-08-30 | Disposition: A | Discharge status: Home / Self Care | Payer: Self-pay | Admitting: Emergency Medicine

## 2014-08-30 LAB — COMPREHENSIVE METABOLIC PANEL
ALK PHOS: 71 U/L
ANION GAP: 8 (ref 7–16)
Albumin: 4.6 g/dL
BILIRUBIN TOTAL: 1.1 mg/dL
BUN: 19 mg/dL
CHLORIDE: 100 mmol/L — AB
CREATININE: 1.19 mg/dL
Calcium, Total: 9.8 mg/dL
Co2: 28 mmol/L
EGFR (African American): >60
EGFR (Non-African Amer.): >60
GLUCOSE: 115 mg/dL — AB
Potassium: 4 mmol/L
SGOT(AST): 27 U/L
SGPT (ALT): 25 U/L
Sodium: 136 mmol/L
Total Protein: 8.6 g/dL — ABNORMAL HIGH

## 2014-08-30 LAB — URINALYSIS, COMPLETE
BACTERIA: NONE SEEN
BLOOD: NEGATIVE
Bilirubin,UR: NEGATIVE
GLUCOSE, UR: NEGATIVE mg/dL (ref 0–75)
Leukocyte Esterase: NEGATIVE
NITRITE: NEGATIVE
PROTEIN: NEGATIVE
Ph: 5 (ref 4.5–8.0)
RBC,UR: NONE SEEN /HPF (ref 0–5)
SPECIFIC GRAVITY: 1.02 (ref 1.003–1.030)
Squamous Epithelial: NONE SEEN

## 2014-08-30 LAB — DRUG SCREEN, URINE
AMPHETAMINES, UR SCREEN: NEGATIVE
Barbiturates, Ur Screen: NEGATIVE
Benzodiazepine, Ur Scrn: NEGATIVE
CANNABINOID 50 NG, UR ~~LOC~~: NEGATIVE
COCAINE METABOLITE, UR ~~LOC~~: POSITIVE
MDMA (ECSTASY) UR SCREEN: NEGATIVE
Methadone, Ur Screen: NEGATIVE
OPIATE, UR SCREEN: NEGATIVE
PHENCYCLIDINE (PCP) UR S: NEGATIVE
Tricyclic, Ur Screen: NEGATIVE

## 2014-08-30 LAB — CBC
HCT: 48.5 % (ref 40.0–52.0)
HGB: 15.8 g/dL (ref 13.0–18.0)
MCH: 30.7 pg (ref 26.0–34.0)
MCHC: 32.5 g/dL (ref 32.0–36.0)
MCV: 94 fL (ref 80–100)
PLATELETS: 195 10*3/uL (ref 150–440)
RBC: 5.14 10*6/uL (ref 4.40–5.90)
RDW: 13.6 % (ref 11.5–14.5)
WBC: 11 10*3/uL — AB (ref 3.8–10.6)

## 2014-08-30 LAB — ACETAMINOPHEN LEVEL

## 2014-08-30 LAB — SALICYLATE LEVEL

## 2014-08-30 LAB — ETHANOL: Ethanol: <5 mg/dL

## 2014-09-03 NOTE — H&P (Signed)
PATIENT NAME:  Carlos, Holland MR#:  [id number] DATE OF BIRTH:  09-20-1965  DATE OF ADMISSION:  01/21/2012  PRIMARY CARE PHYSICIAN: None.  ER PHYSICIAN: Dr. Owens Shark ADMITTING PHYSICIAN: Dr. Pearletha Furl  PRESENTING COMPLAINT: Loss of consciousness.  HISTORY: Patient is a 49 year old male who was in his usual state of health until this afternoon when he passed out while at work. Patient is a Theme park manager by profession, has been working all day in the heat and was going to get something to drink when he passed out. He stated he came around and noted he was having lots of body cramps, vomiting, blurred vision. Denies any headache. No chest pain. No PND, orthopnea or pedal edema. Admits of abdominal cramps. No incontinence of urine or stool. No tongue biting. For this he was brought to the Emergency Room and referred to hospitalist after several courses of rehydration in the ER. Patient was noted to have acute kidney injury with creatinine of 2.4 from baseline of 1.2.   REVIEW OF SYSTEMS: CONSTITUTIONAL: Positive for fatigue, weakness. Denies any weight loss or weight gain. EYES: Positive for blurred vision which is now resolved. No redness or discharge. ENT: No tinnitus, difficulty swallowing, redness of oropharynx. RESPIRATORY: Denies any cough, wheezing. CARDIOVASCULAR: Denies chest pain, orthopnea, pedal edema. No palpitations, exertional dyspnea. Positive for syncope, however. GASTROINTESTINAL: Positive for nausea and vomiting, vomited more than 20 times this evening but no diarrhea. Also positive for abdominal pain, colicky in nature, intensity 4/10 mostly in the right lower quadrant. No recent change in bowel habits or rectal bleeding. GENITOURINARY: Had poor urine output today. ENDOCRINE: No polyuria or polydipsia. No heat or cold intolerance but admits to excessive thirst today. HEMATOLOGIC: No anemia, easy bruising, swollen glands. SKIN: No rashes, change in hair or skin texture. MUSCULOSKELETAL: Positive for  generalized body aches cramps but no swelling, redness, or limited activity. NEURO: No numbness, tremors, dementia, or headache. PSYCHIATRIC: No anxiety or depression.   PAST MEDICAL HISTORY:  1. Gastroesophageal reflux disease. 2. Kidney stones.  3. Seen here in the Emergency Room several times before for episodes of falls from roof and kidney stones.   PAST SURGICAL HISTORY:  1. Appendectomy.  2. Left knee surgery.   SOCIAL HISTORY: Married, lives at home with wife. Active cigarette smoker, 1 pack per day. Denies any alcohol or recreational drug use. Patient is a Theme park manager by profession.   FAMILY HISTORY: Positive for diabetes and CVA in the mother.   ALLERGIES: Penicillin.   MEDICATIONS: Nexium which he takes p.r.n.   PHYSICAL EXAMINATION:  VITAL SIGNS: Temperature 96.6, pulse 81, respiratory rate 18, blood pressure 116/76, oxygen saturation 97% on room air.   GENERAL: Middle-aged male lying on the gurney awake, alert, oriented to time, place, and person, in no distress. Wife at bedside, supportive.   HEENT: Atraumatic, normocephalic. Pupils equal, reactive to light and accommodation. Extraocular movement intact. Mucous membranes pink and dry.   NECK: Supple. No JV distention.   CHEST: Good air entry. Clear to auscultation.   HEART: Regular rate and rhythm. No murmur.   ABDOMEN: Full, moves with respiration, nontender. Bowel sounds hypoactive.   EXTREMITIES: No edema. No clubbing. No deformity.   NEUROLOGICAL: No focal deficits. Affect appropriate to situation.   SKIN: Generalized tattoos all over his body; entire body surface covered with tattoos. No erythema or rashes noted.   LABORATORY, DIAGNOSTIC, AND RADIOLOGICAL DATA: EKG showed normal sinus rhythm, rate of 76. No acute ST wave changes. CBC: White count 14  from baseline of 4 from two months ago. Hemoglobin 14, platelets 194. Chemistry unremarkable except for creatinine of 2.4 from his baseline of 1.2 from two months  ago. BUN 37, glucose 167, calcium 8.9. AST elevated at 57, was previously elevated at 96 two months ago. ALT 87 down from 150 from two months ago. CK 352, was 489 about two months ago. Troponin is negative. Urinalysis shows no infection but shows trace bacteria, calcium oxylate crystals. Salicylate level on urine drug screen is 9.9. Acetaminophen level is negative.   IMPRESSION:  1. Syncope secondary to heat stroke, to rule out arrhythmia. 2. Heat stroke secondary to heat exposure and poor hydration.  3. Acute kidney injury secondary to #2 above.  4. Mild rhabdomyolysis secondary to heat stroke.  5. Hyperglycemia, not otherwise specified.  6. Elevated salicylate level, not otherwise specified  7. Gastroesophageal reflux disease, stable.   8. Elevated LFTs likely from heat stroke but to rule out hepatitis especially in view of his extensive tattoos.  9. Tobacco use.   PLAN: Admit to general medical floor for rehydration. Check orthostatics each shift. For serial cardiac enzymes, TSH, magnesium, PT-INR. Check urine drug screen. Telemonitoring. Hepatitis profile. Check HbA1c. IV fluids for rehydration and to alkalinize the urine with sodium bicarbonate 2 mEq IV x1 then IV fluid infusion with 150 mEq sodium bicarbonate and 1 liter of D5 water. Trend renal function. GI prophylaxis with Protonix. Deep vein thrombosis prophylaxis with sub-Q heparin. Nicotine patch offered. Smoking cessation advised. Recheck salicylate level in am.  CODE STATUS: FULL CODE.   TOTAL PATIENT CARE TIME: 50 minutes.    ____________________________ Jules Husbands Pearletha Furl, MD mia:cms D: 01/21/2012 04:31:05 ET T: 01/21/2012 07:54:02 ET  JOB#: [id number] cc: Cameo Schmiesing I. Pearletha Furl, MD, <Dictator> Carola Frost MD ELECTRONICALLY SIGNED 01/22/2012 2:15

## 2014-09-03 NOTE — Discharge Summary (Signed)
PATIENT NAME:  Carlos Holland, Carlos Holland MR#:  [id number] DATE OF BIRTH:  02-Oct-1965  DATE OF ADMISSION:  01/21/2012 DATE OF DISCHARGE:  01/22/2012  HISTORY: For a detailed note, please see the history and physical done by Dr. Pearletha Furl on admission.   DIAGNOSES AT DISCHARGE:  1. Syncope secondary to heat exhaustion and dehydration.  2. Acute renal failure secondary severe dehydration.  3. Mild rhabdomyolysis secondary to heat exhaustion and dehydration.  4. History of gastroesophageal reflux disease.  5. History of tobacco abuse.   DIET: The patient is being discharged on a regular diet.   ACTIVITY: As tolerated.   FOLLOWUP: Follow-up is within 1 to 2 weeks with his primary care physician, Dr. Delight Stare.   DISCHARGE MEDICATIONS: Nexium 40 mg daily.   PERTINENT STUDIES DURING HOSPITAL COURSE: An ultrasound of the kidneys showing no acute abnormality.   BRIEF HOSPITAL COURSE: This is a 49 year old male who presented to the hospital on 01/21/2012 secondary to syncopal episode.  1. Syncope. The likely cause of the patient's syncope was related to heat exhaustion and dehydration. The patient was apparently working outside for a prolonged period of time and then had a syncopal episode, presented to the ER fairly hypotensive and in acute renal failure. He received aggressive IV fluids and his hemodynamics improved. There was no evidence of any neurocardiogenic syncope. He is currently normotensive with no evidence of orthostasis and has had no further episodes of syncope. He was also observed on telemetry, had no evidence of any arrhythmias. His cardiac markers x3 have been negative. He is therefore being discharged home.  2. Acute renal failure. likely secondary to severe dehydration and heat exhaustion. The patient was aggressively hydrated with IV fluids. His creatinine has now normalized. His renal sonogram did not show any evidence of urinary obstruction.  3. Gastroesophageal reflux disease. The  patient will continue his Nexium as stated.   CODE STATUS: The patient is a FULL CODE.   TIME SPENT: 40 minutes.   ____________________________ Belia Heman. Verdell Carmine, MD vjs:ap D: 01/22/2012 13:03:45 ET T: 01/24/2012 13:17:48 ET JOB#: [id number]  cc: Belia Heman. Verdell Carmine, MD, <Dictator> Marguerita Merles, MD Henreitta Leber MD ELECTRONICALLY SIGNED 01/24/2012 13:29

## 2015-02-13 ENCOUNTER — Emergency Department: Payer: Self-pay

## 2015-02-13 ENCOUNTER — Encounter: Payer: Self-pay | Admitting: Emergency Medicine

## 2015-02-13 ENCOUNTER — Emergency Department
Admission: EM | Admit: 2015-02-13 | Discharge: 2015-02-13 | Disposition: A | Discharge status: Home / Self Care | Payer: Self-pay | Attending: Emergency Medicine | Admitting: Emergency Medicine

## 2015-02-13 DIAGNOSIS — M545 Low back pain, unspecified: Secondary | ICD-10-CM

## 2015-02-13 DIAGNOSIS — Y998 Other external cause status: Secondary | ICD-10-CM | POA: Insufficient documentation

## 2015-02-13 DIAGNOSIS — S3992XA Unspecified injury of lower back, initial encounter: Secondary | ICD-10-CM | POA: Insufficient documentation

## 2015-02-13 DIAGNOSIS — Z720 Tobacco use: Secondary | ICD-10-CM | POA: Insufficient documentation

## 2015-02-13 DIAGNOSIS — Y9289 Other specified places as the place of occurrence of the external cause: Secondary | ICD-10-CM | POA: Insufficient documentation

## 2015-02-13 DIAGNOSIS — W1839XA Other fall on same level, initial encounter: Secondary | ICD-10-CM | POA: Insufficient documentation

## 2015-02-13 DIAGNOSIS — Z880 Allergy status to penicillin: Secondary | ICD-10-CM | POA: Insufficient documentation

## 2015-02-13 DIAGNOSIS — Y9389 Activity, other specified: Secondary | ICD-10-CM | POA: Insufficient documentation

## 2015-02-13 MED ORDER — OXYCODONE-ACETAMINOPHEN 5-325 MG PO TABS
2.0000 | ORAL_TABLET | Freq: Once | ORAL | Status: AC
  Administered 2015-02-13: 2 via ORAL
  Filled 2015-02-13: qty 2

## 2015-02-13 MED ORDER — DIAZEPAM 2 MG PO TABS: 2.0000 mg | ORAL_TABLET | Freq: Three times a day (TID) | ORAL | Status: DC | PRN

## 2015-02-13 MED ORDER — OXYCODONE-ACETAMINOPHEN 5-325 MG PO TABS: 1.0000 | ORAL_TABLET | ORAL | Status: DC | PRN

## 2015-02-13 NOTE — ED Provider Notes (Signed)
[facility] Emergency Department Provider Note ____________________________________________  Time seen: Approximately 8:49 AM  I have reviewed the triage vital signs and the nursing notes.   HISTORY  Chief Complaint No chief complaint on file.   HPI Carlos Holland is a 49 y.o. male is here with complaint of back pain. Patient states that he was on a roof yesterday working when he lost his footing and fell. He fell onto the roof and landed on his back. He denies any previous back injuries. He denies any hematuria, nausea or vomiting. There was no head injury or loss of consciousness. He denies any vision changes. He has not taken any over-the-counter medication prior to his arrival. Currently he rates his pain 8 out of 10. He denies any paresthesias into his extremities. Pain is constant is moving and decreases somewhat if he is lying still.   History reviewed. No pertinent past medical history.  There are no active problems to display for this patient.   History reviewed. No pertinent past surgical history.  Current Outpatient Rx  Name  Route  Sig  Dispense  Refill  . diazepam (VALIUM) 2 MG tablet   Oral   Take 1 tablet (2 mg total) by mouth every 8 (eight) hours as needed for muscle spasms.   9 tablet   0   . oxyCODONE-acetaminophen (PERCOCET) 5-325 MG tablet   Oral   Take 1 tablet by mouth every 4 (four) hours as needed for severe pain.   20 tablet   0     Allergies Penicillins and Tramadol  History reviewed. No pertinent family history.  Social History Social History  Substance Use Topics  . Smoking status: Current Every Day Smoker  . Smokeless tobacco: None  . Alcohol Use: No    Review of Systems Constitutional: No fever/chills Eyes: No visual changes. Cardiovascular: Denies chest pain. Respiratory: Denies shortness of breath. Gastrointestinal: No abdominal pain.  No nausea, no vomiting.   Genitourinary: Negative for  dysuria. Musculoskeletal: Positive for back pain. Skin: Negative for rash. Neurological: Negative for headaches, focal weakness or numbness.  10-point ROS otherwise negative.  ____________________________________________   PHYSICAL EXAM:  VITAL SIGNS: ED Triage Vitals  Enc Vitals Group     BP --      Pulse --      Resp --      Temp --      Temp src --      SpO2 --      Weight --      Height --      Head Cir --      Peak Flow --      Pain Score --      Pain Loc --      Pain Edu? --      Excl. in [place]? --     Constitutional: Alert and oriented. Well appearing and in no acute distress. Eyes: Conjunctivae are normal. PERRL. EOMI. Head: Atraumatic. Nose: No congestion/rhinnorhea. Neck: No stridor.  Nontender cervical spine on palpation Cardiovascular: Normal rate, regular rhythm. Grossly normal heart sounds.  Good peripheral circulation. Respiratory: Normal respiratory effort.  No retractions. Lungs CTAB. Gastrointestinal: Soft and nontender. No distention. Bowel sounds normoactive 4 quadrants Musculoskeletal: Back no gross deformity. There is moderate tenderness on palpation of the lumbar spine L4, L5-S1 area and paravertebral muscles. No ecchymosis is noted in this area. Nontender bilateral ribs to palpation. No lower extremity tenderness nor edema.  No joint effusions. Neurologic:  Normal  speech and language. No gross focal neurologic deficits are appreciated. No gait instability. Skin:  Skin is warm, dry and intact. No rash noted. Psychiatric: Mood and affect are normal. Speech and behavior are normal.  ____________________________________________   LABS (all labs ordered are listed, but only abnormal results are displayed)  Labs Reviewed - No data to display RADIOLOGY  Lumbar spine x-ray per radiologist shows no fracture I, Johnn Hai, personally viewed and evaluated these images (plain radiographs) as part of my medical decision making.   ____________________________________________   PROCEDURES  Procedure(s) performed: None  Critical Care performed: No  ____________________________________________   INITIAL IMPRESSION / ASSESSMENT AND PLAN / ED COURSE  Pertinent labs & imaging results that were available during my care of the patient were reviewed by me and considered in my medical decision making (see chart for details).   Patient was given a prescription for Percocet 5 mg 4 times a day for pain along with Valium 2 mg needed for muscle spasms. He is to alternate ice and heat to see which one helps more. 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 He is to follow-up with the orthopedist if any continued problems. _______________________________________   FINAL CLINICAL IMPRESSION(S) / ED DIAGNOSES  Final diagnoses:  Acute lumbar back pain      Johnn Hai, PA-C 02/13/15 1147  Orbie Pyo, MD 02/13/15 1213

## 2015-02-13 NOTE — ED Notes (Signed)
States he lost his footing and fell onto a roof yesterday  Having pain to lower back  Ambulates slowly d/t increased pain

## 2015-04-10 ENCOUNTER — Encounter: Payer: Self-pay | Admitting: Emergency Medicine

## 2015-04-10 ENCOUNTER — Emergency Department
Admission: EM | Admit: 2015-04-10 | Discharge: 2015-04-11 | Disposition: A | Discharge status: Home / Self Care | Payer: Self-pay | Attending: Emergency Medicine | Admitting: Emergency Medicine

## 2015-04-10 DIAGNOSIS — Z880 Allergy status to penicillin: Secondary | ICD-10-CM | POA: Insufficient documentation

## 2015-04-10 DIAGNOSIS — Y288XXA Contact with other sharp object, undetermined intent, initial encounter: Secondary | ICD-10-CM | POA: Insufficient documentation

## 2015-04-10 DIAGNOSIS — S61214A Laceration without foreign body of right ring finger without damage to nail, initial encounter: Secondary | ICD-10-CM | POA: Insufficient documentation

## 2015-04-10 DIAGNOSIS — S61215A Laceration without foreign body of left ring finger without damage to nail, initial encounter: Secondary | ICD-10-CM | POA: Insufficient documentation

## 2015-04-10 DIAGNOSIS — Y9289 Other specified places as the place of occurrence of the external cause: Secondary | ICD-10-CM | POA: Insufficient documentation

## 2015-04-10 DIAGNOSIS — S61213A Laceration without foreign body of left middle finger without damage to nail, initial encounter: Secondary | ICD-10-CM | POA: Insufficient documentation

## 2015-04-10 DIAGNOSIS — Y9389 Activity, other specified: Secondary | ICD-10-CM | POA: Insufficient documentation

## 2015-04-10 DIAGNOSIS — Y998 Other external cause status: Secondary | ICD-10-CM | POA: Insufficient documentation

## 2015-04-10 DIAGNOSIS — S61219A Laceration without foreign body of unspecified finger without damage to nail, initial encounter: Secondary | ICD-10-CM

## 2015-04-10 DIAGNOSIS — F17210 Nicotine dependence, cigarettes, uncomplicated: Secondary | ICD-10-CM | POA: Insufficient documentation

## 2015-04-10 NOTE — ED Notes (Signed)
Cut right fourth finger and left third and fourth finger with knife while sheering a deer.  + CMS.  C/o pain to lacerations.  Ibuprofen taken at 1530, no effect.

## 2015-04-10 NOTE — ED Notes (Signed)
Pt. States cleaning a deer this afternoon around noon.  Pt. States friend was using a knife when the knife slipped from cutting deer and struck pt. Across both hands.  Pt. Has laceration to 4th finger of rt. Hand.  Pt. Also has laceration to 3rd and 4th finger of lt. Hand.  Bleeding controlled at this time.

## 2015-04-11 MED ORDER — CLINDAMYCIN HCL 300 MG PO CAPS: 300.0000 mg | ORAL_CAPSULE | Freq: Three times a day (TID) | ORAL | Status: AC

## 2015-04-11 MED ORDER — BACITRACIN ZINC 500 UNIT/GM EX OINT
TOPICAL_OINTMENT | CUTANEOUS | Status: AC
Start: 2015-04-11 — End: 2015-04-11
  Administered 2015-04-11: 1 via TOPICAL
  Filled 2015-04-11: qty 0.9

## 2015-04-11 MED ORDER — BACITRACIN ZINC 500 UNIT/GM EX OINT
TOPICAL_OINTMENT | CUTANEOUS | Status: DC | PRN
  Administered 2015-04-11: 1 via TOPICAL

## 2015-04-11 MED ORDER — CLINDAMYCIN HCL 150 MG PO CAPS
300.0000 mg | ORAL_CAPSULE | Freq: Once | ORAL | Status: AC
  Administered 2015-04-11: 300 mg via ORAL
  Filled 2015-04-11: qty 2

## 2015-04-11 MED ORDER — OXYCODONE-ACETAMINOPHEN 5-325 MG PO TABS
1.0000 | ORAL_TABLET | Freq: Once | ORAL | Status: AC
  Administered 2015-04-11: 1 via ORAL
  Filled 2015-04-11: qty 1

## 2015-04-11 MED ORDER — CEPHALEXIN 500 MG PO CAPS: 500.0000 mg | ORAL_CAPSULE | Freq: Once | ORAL | Status: DC

## 2015-04-11 NOTE — ED Provider Notes (Signed)
[facility] Emergency Department Provider Note  ____________________________________________  Time seen: 12:05 AM  I have reviewed the triage vital signs and the nursing notes.   HISTORY  Chief Complaint Laceration    HPI Carlos Holland is a 49 y.o. male presents with cuts to his right fourth finger and left third and fourth finger which occurred at 11:30 AM yesterday while sheering a deer.    Past medical history No Pertinent past medical history There are no active problems to display for this patient.   Past Surgical History  Procedure Laterality Date  . Appendectomy    . Knee surgery Left     Current Outpatient Rx  Name  Route  Sig  Dispense  Refill  . diazepam (VALIUM) 2 MG tablet   Oral   Take 1 tablet (2 mg total) by mouth every 8 (eight) hours as needed for muscle spasms.   9 tablet   0   . oxyCODONE-acetaminophen (PERCOCET) 5-325 MG tablet   Oral   Take 1 tablet by mouth every 4 (four) hours as needed for severe pain.   20 tablet   0     Allergies Bee venom; Penicillins; and Tramadol  No family history on file.  Social History Social History  Substance Use Topics  . Smoking status: Current Every Day Smoker -- 1.50 packs/day    Types: Cigarettes  . Smokeless tobacco: None  . Alcohol Use: No    Review of Systems  Constitutional: Negative for fever. Eyes: Negative for visual changes. ENT: Negative for sore throat. Cardiovascular: Negative for chest pain. Respiratory: Negative for shortness of breath. Gastrointestinal: Negative for abdominal pain, vomiting and diarrhea. Genitourinary: Negative for dysuria. Musculoskeletal: Negative for back pain. Skin: Negative for rash. Positive laceration to right fourth finger left third and fourth finger Neurological: Negative for headaches, focal weakness or numbness.   10-point ROS otherwise negative.  ____________________________________________   PHYSICAL  EXAM:  VITAL SIGNS: ED Triage Vitals  Enc Vitals Group     BP 04/10/15 2248 147/76 mmHg     Pulse Rate 04/10/15 2248 70     Resp 04/10/15 2248 16     Temp 04/10/15 2248 98.2 F (36.8 C)     Temp Source 04/10/15 2248 Oral     SpO2 04/10/15 2248 97 %     Weight 04/10/15 2248 218 lb (98.884 kg)     Height 04/10/15 2248 6\' 4"  (1.93 m)     Head Cir --      Peak Flow --      Pain Score 04/10/15 2249 8     Pain Loc --      Pain Edu? --      Excl. in [place]? --      Constitutional: Alert and oriented. Well appearing and in no distress. Eyes: Conjunctivae are normal. PERRL. Normal extraocular movements. ENT   Head: Normocephalic and atraumatic.   Nose: No congestion/rhinnorhea.   Mouth/Throat: Mucous membranes are moist.   Neck: No stridor. Hematological/Lymphatic/Immunilogical: No cervical lymphadenopathy. Cardiovascular: Normal rate, regular rhythm. Normal and symmetric distal pulses are present in all extremities. No murmurs, rubs, or gallops. Respiratory: Normal respiratory effort without tachypnea nor retractions. Breath sounds are clear and equal bilaterally. No wheezes/rales/rhonchi. Gastrointestinal: Soft and nontender. No distention. There is no CVA tenderness. Genitourinary: deferred Musculoskeletal: Nontender with normal range of motion in all extremities. No joint effusions.  No lower extremity tenderness nor edema. Neurologic:  Normal speech and language. No gross focal neurologic  deficits are appreciated. Speech is normal.  Skin: 1.5 cm laceration to the dorsal aspect of the right third finger at the MIP joint. 1 cm laceration to the left third and fourth digit. All wounds reapproximated without any active bleeding     INITIAL IMPRESSION / ASSESSMENT AND PLAN / ED COURSE  Pertinent labs & imaging results that were available during my care of the patient were reviewed by me and considered in my medical decision making (see chart for details).  Given the  nature of the wounds and time since injury wounds will be allowed to heal via secondary intention. Patient received Keflex by mouth antibiotics in the emergency department and will be prescribed the same for home. I discussed warning signs of infection with the patient and his significant other at bedside which would require need to return the emergency department immediately.  ____________________________________________   FINAL CLINICAL IMPRESSION(S) / ED DIAGNOSES  Final diagnoses:  Finger laceration, initial encounter      Gregor Hams, MD 04/11/15 [phone]

## 2015-04-11 NOTE — ED Notes (Signed)
Put steri-strips on rt. Finger.  Put bacitracin on all lacerations on bilateral hands.  Cleaned wound areas to affected fingers.

## 2015-04-11 NOTE — ED Notes (Signed)
Pt. Going home with wife. 

## 2015-04-11 NOTE — Discharge Instructions (Signed)

## 2016-01-07 ENCOUNTER — Emergency Department: Payer: Self-pay

## 2016-01-07 ENCOUNTER — Encounter: Payer: Self-pay | Admitting: Emergency Medicine

## 2016-01-07 DIAGNOSIS — R319 Hematuria, unspecified: Secondary | ICD-10-CM | POA: Insufficient documentation

## 2016-01-07 DIAGNOSIS — F17210 Nicotine dependence, cigarettes, uncomplicated: Secondary | ICD-10-CM | POA: Insufficient documentation

## 2016-01-07 DIAGNOSIS — Z87442 Personal history of urinary calculi: Secondary | ICD-10-CM | POA: Insufficient documentation

## 2016-01-07 DIAGNOSIS — M549 Dorsalgia, unspecified: Secondary | ICD-10-CM | POA: Insufficient documentation

## 2016-01-07 DIAGNOSIS — R109 Unspecified abdominal pain: Secondary | ICD-10-CM | POA: Insufficient documentation

## 2016-01-07 LAB — URINALYSIS COMPLETE WITH MICROSCOPIC (ARMC ONLY)
BILIRUBIN URINE: NEGATIVE
Bacteria, UA: NONE SEEN
Glucose, UA: NEGATIVE mg/dL
HGB URINE DIPSTICK: NEGATIVE
KETONES UR: NEGATIVE mg/dL
LEUKOCYTES UA: NEGATIVE
NITRITE: NEGATIVE
PH: 5 (ref 5.0–8.0)
Protein, ur: NEGATIVE mg/dL
SQUAMOUS EPITHELIAL / LPF: NONE SEEN
Specific Gravity, Urine: 1.024 (ref 1.005–1.030)

## 2016-01-07 LAB — CBC
HCT: 41.8 % (ref 40.0–52.0)
Hemoglobin: 14.4 g/dL (ref 13.0–18.0)
MCH: 32.3 pg (ref 26.0–34.0)
MCHC: 34.5 g/dL (ref 32.0–36.0)
MCV: 93.6 fL (ref 80.0–100.0)
PLATELETS: 163 10*3/uL (ref 150–440)
RBC: 4.46 MIL/uL (ref 4.40–5.90)
RDW: 14.2 % (ref 11.5–14.5)
WBC: 11.4 10*3/uL — ABNORMAL HIGH (ref 3.8–10.6)

## 2016-01-07 LAB — BASIC METABOLIC PANEL
ANION GAP: 9 (ref 5–15)
BUN: 20 mg/dL (ref 6–20)
CALCIUM: 9.6 mg/dL (ref 8.9–10.3)
CO2: 23 mmol/L (ref 22–32)
CREATININE: 1.18 mg/dL (ref 0.61–1.24)
Chloride: 109 mmol/L (ref 101–111)
GFR calc Af Amer: >60 mL/min (ref >60)
GLUCOSE: 115 mg/dL — AB (ref 65–99)
Potassium: 3.8 mmol/L (ref 3.5–5.1)
Sodium: 141 mmol/L (ref 135–145)

## 2016-01-07 MED ORDER — ONDANSETRON HCL 4 MG/2ML IJ SOLN
4.0000 mg | Freq: Once | INTRAMUSCULAR | Status: AC
  Administered 2016-01-07: 4 mg via INTRAVENOUS
  Filled 2016-01-07: qty 2

## 2016-01-07 MED ORDER — MORPHINE SULFATE (PF) 4 MG/ML IV SOLN
4.0000 mg | Freq: Once | INTRAVENOUS | Status: AC
  Administered 2016-01-07: 4 mg via INTRAVENOUS
  Filled 2016-01-07: qty 1

## 2016-01-07 NOTE — ED Triage Notes (Signed)
Pt ambulatory to triage, reports right flank pain x 1 day, w/ hematuria, hx of kidney stones.  Pt NAD at this time but in visible discomfort.

## 2016-01-08 ENCOUNTER — Emergency Department
Admission: EM | Admit: 2016-01-08 | Discharge: 2016-01-08 | Disposition: A | Discharge status: Home / Self Care | Payer: Self-pay | Attending: Emergency Medicine | Admitting: Emergency Medicine

## 2016-01-08 DIAGNOSIS — R109 Unspecified abdominal pain: Secondary | ICD-10-CM

## 2016-01-08 MED ORDER — OXYCODONE-ACETAMINOPHEN 5-325 MG PO TABS
1.0000 | ORAL_TABLET | Freq: Once | ORAL | Status: AC
  Administered 2016-01-08: 1 via ORAL
  Filled 2016-01-08: qty 1

## 2016-01-08 MED ORDER — ONDANSETRON 4 MG PO TBDP: 4.0000 mg | ORAL_TABLET | Freq: Three times a day (TID) | ORAL | 0 refills | Status: DC | PRN

## 2016-01-08 MED ORDER — OXYCODONE-ACETAMINOPHEN 5-325 MG PO TABS: 1.0000 | ORAL_TABLET | ORAL | 0 refills | Status: DC | PRN

## 2016-01-08 MED ORDER — IBUPROFEN 800 MG PO TABS: 800.0000 mg | ORAL_TABLET | Freq: Three times a day (TID) | ORAL | 0 refills | Status: DC | PRN

## 2016-01-08 MED ORDER — TAMSULOSIN HCL 0.4 MG PO CAPS: 0.4000 mg | ORAL_CAPSULE | Freq: Every day | ORAL | 0 refills | Status: DC

## 2016-01-08 MED ORDER — TAMSULOSIN HCL 0.4 MG PO CAPS
0.4000 mg | ORAL_CAPSULE | Freq: Once | ORAL | Status: AC
  Administered 2016-01-08: 0.4 mg via ORAL
  Filled 2016-01-08: qty 1

## 2016-01-08 NOTE — ED Notes (Signed)
Pt given sprite and cranberry juice per Pt request.

## 2016-01-08 NOTE — ED Notes (Signed)
Pt attempted to sign e-signature. Computer not picking up signature from [employer]. Pt verbalized understanding for discharge. No further questions.

## 2016-01-08 NOTE — ED Provider Notes (Signed)
[facility] Emergency Department Provider Note   ____________________________________________   First MD Initiated Contact with Patient 01/08/16 [phone]     (approximate)  I have reviewed the triage vital signs and the nursing notes.   HISTORY  Chief Complaint Flank Pain    HPI Carlos Holland is a 50 y.o. male who presents to the ED from home with a chief complaint of right flank pain. Patient reports history of kidney stones. States he had some right flank pain and hematuria last week while he was at a town. Reports increase in right flank pain yesterday with light hematuria. Denies associated fever, chills, chest pain, shortness of breath, vomiting, diarrhea. Denies testicular pain or swelling. Denies recent trauma. Nothing makes his symptoms better or worse.   Past medical history Kidney stones  There are no active problems to display for this patient.   Past Surgical History:  Procedure Laterality Date  . APPENDECTOMY    . KNEE SURGERY Left     Prior to Admission medications   Medication Sig Start Date End Date Taking? Authorizing Provider  diazepam (VALIUM) 2 MG tablet Take 1 tablet (2 mg total) by mouth every 8 (eight) hours as needed for muscle spasms. 02/13/15   Johnn Hai, PA-C  ibuprofen (ADVIL,MOTRIN) 800 MG tablet Take 1 tablet (800 mg total) by mouth every 8 (eight) hours as needed for moderate pain. 01/08/16   Paulette Blanch, MD  ondansetron (ZOFRAN ODT) 4 MG disintegrating tablet Take 1 tablet (4 mg total) by mouth every 8 (eight) hours as needed for nausea or vomiting. 01/08/16   Paulette Blanch, MD  oxyCODONE-acetaminophen (ROXICET) 5-325 MG tablet Take 1 tablet by mouth every 4 (four) hours as needed for severe pain. 01/08/16   Paulette Blanch, MD  tamsulosin (FLOMAX) 0.4 MG CAPS capsule Take 1 capsule (0.4 mg total) by mouth daily. 01/08/16   Paulette Blanch, MD    Allergies Bee venom; Penicillins; and Tramadol  History reviewed. No  pertinent family history.  Social History Social History  Substance Use Topics  . Smoking status: Current Every Day Smoker    Packs/day: 1.50    Types: Cigarettes  . Smokeless tobacco: Never Used  . Alcohol use No    Review of Systems  Constitutional: No fever/chills. Eyes: No visual changes. ENT: No sore throat. Cardiovascular: Denies chest pain. Respiratory: Denies shortness of breath. Gastrointestinal: No abdominal pain.  No nausea, no vomiting.  No diarrhea.  No constipation. Genitourinary: Negative for dysuria. Musculoskeletal: Positive for right back pain. Skin: Negative for rash. Neurological: Negative for headaches, focal weakness or numbness.  10-point ROS otherwise negative.  ____________________________________________   PHYSICAL EXAM:  VITAL SIGNS: ED Triage Vitals  Enc Vitals Group     BP 01/07/16 2228 132/83     Pulse Rate 01/07/16 2228 79     Resp 01/07/16 2228 20     Temp 01/07/16 2228 97.7 F (36.5 C)     Temp Source 01/07/16 2228 Oral     SpO2 01/07/16 2228 95 %     Weight 01/07/16 2229 214 lb (97.1 kg)     Height 01/07/16 2229 6\' 4"  (1.93 m)     Head Circumference --      Peak Flow --      Pain Score 01/07/16 2229 8     Pain Loc --      Pain Edu? --      Excl. in [place]? --  Constitutional: Alert and oriented. Well appearing and in no acute distress. Eyes: Conjunctivae are normal. PERRL. EOMI. Head: Atraumatic. Nose: No congestion/rhinnorhea. Mouth/Throat: Mucous membranes are moist.  Oropharynx non-erythematous. Neck: No stridor.   Cardiovascular: Normal rate, regular rhythm. Grossly normal heart sounds.  Good peripheral circulation. Respiratory: Normal respiratory effort.  No retractions. Lungs CTAB. Gastrointestinal: Soft and nontender. No distention. No abdominal bruits. Mild right CVA tenderness. Musculoskeletal: No lower extremity tenderness nor edema.  No joint effusions. Neurologic:  Normal speech and language. No gross focal  neurologic deficits are appreciated. No gait instability. Skin:  Skin is warm, dry and intact. No rash noted. Psychiatric: Mood and affect are normal. Speech and behavior are normal.  ____________________________________________   LABS (all labs ordered are listed, but only abnormal results are displayed)  Labs Reviewed  URINALYSIS COMPLETEWITH MICROSCOPIC ([place]) - Abnormal; Notable for the following:       Result Value   Color, Urine YELLOW (*)    APPearance CLEAR (*)    All other components within normal limits  BASIC METABOLIC PANEL - Abnormal; Notable for the following:    Glucose, Bld 115 (*)    All other components within normal limits  CBC - Abnormal; Notable for the following:    WBC 11.4 (*)    All other components within normal limits   ____________________________________________  EKG  None ____________________________________________  RADIOLOGY  CT renal colic study interpreted per Dr. Toney Reil: 1. No urolithiasis or hydronephrosis is identified. No perinephric  stranding.  2. No acute process is identified as explanation for pain.    ____________________________________________   PROCEDURES  Procedure(s) performed: None  Procedures  Critical Care performed: No  ____________________________________________   INITIAL IMPRESSION / ASSESSMENT AND PLAN / ED COURSE  Pertinent labs & imaging results that were available during my care of the patient were reviewed by me and considered in my medical decision making (see chart for details).  50 year old male with a past history of kidney stones who presents with right flank pain and hematuria. Laboratory, urinalysis and imaging results unremarkable. Patient received morphine prior to arrival to treatment room and pain is improved. Will start on Flomax, limited prescription for analgesia and follow-up with urology as needed. Strict return precautions given. Patient and spouse verbalize understanding and  agree with plan of care.  Clinical Course     ____________________________________________   FINAL CLINICAL IMPRESSION(S) / ED DIAGNOSES  Final diagnoses:  Right flank pain      NEW MEDICATIONS STARTED DURING THIS VISIT:  New Prescriptions   IBUPROFEN (ADVIL,MOTRIN) 800 MG TABLET    Take 1 tablet (800 mg total) by mouth every 8 (eight) hours as needed for moderate pain.   ONDANSETRON (ZOFRAN ODT) 4 MG DISINTEGRATING TABLET    Take 1 tablet (4 mg total) by mouth every 8 (eight) hours as needed for nausea or vomiting.   OXYCODONE-ACETAMINOPHEN (ROXICET) 5-325 MG TABLET    Take 1 tablet by mouth every 4 (four) hours as needed for severe pain.   TAMSULOSIN (FLOMAX) 0.4 MG CAPS CAPSULE    Take 1 capsule (0.4 mg total) by mouth daily.     Note:  This document was prepared using Dragon voice recognition software and may include unintentional dictation errors.    Paulette Blanch, MD 01/08/16 [phone]

## 2016-01-08 NOTE — Discharge Instructions (Signed)
1. You may take medicines as needed for pain and nausea (Motrin/Percocet/Zofran #15). 2. Drink plenty of fluids daily. 3. Return to the ER for worsening symptoms, persistent vomiting, difficulty breathing or other concerns.

## 2016-01-16 DIAGNOSIS — Z87442 Personal history of urinary calculi: Secondary | ICD-10-CM

## 2016-01-16 HISTORY — DX: Personal history of urinary calculi: Z87.442

## 2016-04-27 ENCOUNTER — Encounter: Payer: Self-pay | Admitting: Emergency Medicine

## 2016-04-27 DIAGNOSIS — N329 Bladder disorder, unspecified: Secondary | ICD-10-CM | POA: Insufficient documentation

## 2016-04-27 DIAGNOSIS — F17210 Nicotine dependence, cigarettes, uncomplicated: Secondary | ICD-10-CM | POA: Insufficient documentation

## 2016-04-27 LAB — BASIC METABOLIC PANEL
ANION GAP: 5 (ref 5–15)
BUN: 15 mg/dL (ref 6–20)
CO2: 27 mmol/L (ref 22–32)
CREATININE: 1.11 mg/dL (ref 0.61–1.24)
Calcium: 9.4 mg/dL (ref 8.9–10.3)
Chloride: 107 mmol/L (ref 101–111)
GFR calc Af Amer: >60 mL/min (ref >60)
GLUCOSE: 114 mg/dL — AB (ref 65–99)
Potassium: 3.8 mmol/L (ref 3.5–5.1)
Sodium: 139 mmol/L (ref 135–145)

## 2016-04-27 LAB — URINALYSIS, COMPLETE (UACMP) WITH MICROSCOPIC
BACTERIA UA: NONE SEEN
BILIRUBIN URINE: NEGATIVE
GLUCOSE, UA: NEGATIVE mg/dL
HGB URINE DIPSTICK: NEGATIVE
Ketones, ur: NEGATIVE mg/dL
LEUKOCYTES UA: NEGATIVE
NITRITE: NEGATIVE
Protein, ur: NEGATIVE mg/dL
RBC / HPF: NONE SEEN RBC/hpf (ref 0–5)
SPECIFIC GRAVITY, URINE: 1.018 (ref 1.005–1.030)
Squamous Epithelial / LPF: NONE SEEN
pH: 5 (ref 5.0–8.0)

## 2016-04-27 LAB — CBC
HCT: 43.5 % (ref 40.0–52.0)
Hemoglobin: 15 g/dL (ref 13.0–18.0)
MCH: 32.5 pg (ref 26.0–34.0)
MCHC: 34.6 g/dL (ref 32.0–36.0)
MCV: 94.1 fL (ref 80.0–100.0)
PLATELETS: 175 10*3/uL (ref 150–440)
RBC: 4.62 MIL/uL (ref 4.40–5.90)
RDW: 13.7 % (ref 11.5–14.5)
WBC: 10 10*3/uL (ref 3.8–10.6)

## 2016-04-27 NOTE — ED Triage Notes (Signed)
Pt ambulatory with slow gait to triage, no distress noted. Pt c/o of lower back pain and urinary frequency x1 day. Pt has HX of kidney stones, sts today's symptoms are similar to previous.

## 2016-04-27 NOTE — ED Notes (Signed)
Triage placed under Tech by accident, All filled areas are accurate according to this Triage nurse. Orders placed by this Triage nurse.

## 2016-04-28 ENCOUNTER — Emergency Department: Payer: Self-pay

## 2016-04-28 ENCOUNTER — Emergency Department
Admission: EM | Admit: 2016-04-28 | Discharge: 2016-04-28 | Disposition: A | Discharge status: Home / Self Care | Payer: Self-pay | Attending: Emergency Medicine | Admitting: Emergency Medicine

## 2016-04-28 DIAGNOSIS — R109 Unspecified abdominal pain: Secondary | ICD-10-CM

## 2016-04-28 DIAGNOSIS — R350 Frequency of micturition: Secondary | ICD-10-CM

## 2016-04-28 DIAGNOSIS — N3289 Other specified disorders of bladder: Secondary | ICD-10-CM

## 2016-04-28 MED ORDER — ONDANSETRON 4 MG PO TBDP: 4.0000 mg | ORAL_TABLET | Freq: Three times a day (TID) | ORAL | 0 refills | Status: DC | PRN

## 2016-04-28 MED ORDER — ONDANSETRON HCL 4 MG/2ML IJ SOLN
4.0000 mg | Freq: Once | INTRAMUSCULAR | Status: AC
  Administered 2016-04-28: 4 mg via INTRAVENOUS
  Filled 2016-04-28: qty 2

## 2016-04-28 MED ORDER — MORPHINE SULFATE (PF) 4 MG/ML IV SOLN
4.0000 mg | Freq: Once | INTRAVENOUS | Status: AC
  Administered 2016-04-28: 4 mg via INTRAVENOUS
  Filled 2016-04-28: qty 1

## 2016-04-28 MED ORDER — TAMSULOSIN HCL 0.4 MG PO CAPS: 0.4000 mg | ORAL_CAPSULE | Freq: Every day | ORAL | 0 refills | Status: DC

## 2016-04-28 MED ORDER — OXYCODONE-ACETAMINOPHEN 5-325 MG PO TABS: 1.0000 | ORAL_TABLET | Freq: Four times a day (QID) | ORAL | 0 refills | Status: DC | PRN

## 2016-04-28 MED ORDER — KETOROLAC TROMETHAMINE 30 MG/ML IJ SOLN
30.0000 mg | Freq: Once | INTRAMUSCULAR | Status: AC
  Administered 2016-04-28: 30 mg via INTRAVENOUS
  Filled 2016-04-28: qty 1

## 2016-04-28 MED ORDER — LIDOCAINE 5 % EX PTCH
1.0000 | MEDICATED_PATCH | CUTANEOUS | Status: DC
  Administered 2016-04-28: 1 via TRANSDERMAL
  Filled 2016-04-28: qty 1

## 2016-04-28 NOTE — ED Notes (Signed)
Pt has right side flank pain since yesterday with nausea.  No vomiting.  Hx of kidney stones.  Pt has urinary frequency .  No injury to back .   Pt alert.  Family at bedside.

## 2016-04-28 NOTE — ED Provider Notes (Signed)
[facility] Emergency Department Provider Note   ____________________________________________   First MD Initiated Contact with Patient 04/28/16 0240     (approximate)  I have reviewed the triage vital signs and the nursing notes.   HISTORY  Chief Complaint Back Pain and Urinary Frequency    HPI Carlos Holland is a 50 y.o. male who comes into the hospital today with some pain in his right back. He reports that the pain started yesterday. The patient is also been urinating a lot. The patient has had kidney stones in the past and he is concerned he may have another kidney stone. The patient took a BC powder but it did not help his symptoms. He's had some nausea with no vomiting and no fevers. He reports that he was working a lot yesterday Z didn't pay attention to if there was blood in his urine. The patient rates his pain an 8 out of 10 in intensity. It is worse whenever he moves around. The pain radiates around from his back all the way to his side. He decided to come in to get checked out.   History reviewed. No pertinent past medical history.  There are no active problems to display for this patient.   Past Surgical History:  Procedure Laterality Date  . APPENDECTOMY    . KNEE SURGERY Left     Prior to Admission medications   Medication Sig Start Date End Date Taking? Authorizing Provider  diazepam (VALIUM) 2 MG tablet Take 1 tablet (2 mg total) by mouth every 8 (eight) hours as needed for muscle spasms. 02/13/15   Johnn Hai, PA-C  ibuprofen (ADVIL,MOTRIN) 800 MG tablet Take 1 tablet (800 mg total) by mouth every 8 (eight) hours as needed for moderate pain. 01/08/16   Paulette Blanch, MD  ondansetron (ZOFRAN ODT) 4 MG disintegrating tablet Take 1 tablet (4 mg total) by mouth every 8 (eight) hours as needed for nausea or vomiting. 01/08/16   Paulette Blanch, MD  ondansetron (ZOFRAN ODT) 4 MG disintegrating tablet Take 1 tablet (4 mg total) by mouth  every 8 (eight) hours as needed for nausea or vomiting. 04/28/16   Loney Hering, MD  oxyCODONE-acetaminophen (ROXICET) 5-325 MG tablet Take 1 tablet by mouth every 4 (four) hours as needed for severe pain. 01/08/16   Paulette Blanch, MD  oxyCODONE-acetaminophen (ROXICET) 5-325 MG tablet Take 1 tablet by mouth every 6 (six) hours as needed. 04/28/16   Loney Hering, MD  tamsulosin (FLOMAX) 0.4 MG CAPS capsule Take 1 capsule (0.4 mg total) by mouth daily. 01/08/16   Paulette Blanch, MD  tamsulosin (FLOMAX) 0.4 MG CAPS capsule Take 1 capsule (0.4 mg total) by mouth daily. 04/28/16   Loney Hering, MD    Allergies Bee venom; Penicillins; and Tramadol  History reviewed. No pertinent family history.  Social History Social History  Substance Use Topics  . Smoking status: Current Every Day Smoker    Packs/day: 1.50    Types: Cigarettes  . Smokeless tobacco: Never Used  . Alcohol use No    Review of Systems Constitutional: No fever/chills Eyes: No visual changes. ENT: No sore throat. Cardiovascular: Denies chest pain. Respiratory: Denies shortness of breath. Gastrointestinal: No abdominal pain.  No nausea, no vomiting.  No diarrhea.  No constipation. Genitourinary: Urinary frequency Musculoskeletal: back pain. Skin: Negative for rash. Neurological: Negative for headaches, focal weakness or numbness.  10-point ROS otherwise negative.  ____________________________________________   PHYSICAL EXAM:  VITAL SIGNS: ED Triage Vitals  Enc Vitals Group     BP 04/27/16 2217 134/87     Pulse Rate 04/27/16 2217 65     Resp 04/27/16 2217 15     Temp 04/27/16 2217 98.1 F (36.7 C)     Temp Source 04/27/16 2217 Oral     SpO2 04/27/16 2217 98 %     Weight 04/27/16 2218 185 lb (83.9 kg)     Height 04/27/16 2218 6\' 4"  (1.93 m)     Head Circumference --      Peak Flow --      Pain Score 04/27/16 2218 8     Pain Loc --      Pain Edu? --      Excl. in [place]? --     Constitutional: Alert  and oriented. Well appearing and in Moderate distress. Eyes: Conjunctivae are normal. PERRL. EOMI. Head: Atraumatic. Nose: No congestion/rhinnorhea. Mouth/Throat: Mucous membranes are moist.  Oropharynx non-erythematous. Cardiovascular: Normal rate, regular rhythm. Grossly normal heart sounds.  Good peripheral circulation. Respiratory: Normal respiratory effort.  No retractions. Lungs CTAB. Gastrointestinal: Soft and nontender. No distention. Positive bowel sounds Musculoskeletal: No lower extremity tenderness nor edema.   Neurologic:  Normal speech and language.  Skin:  Skin is warm, dry and intact.  Psychiatric: Mood and affect are normal.   ____________________________________________   LABS (all labs ordered are listed, but only abnormal results are displayed)  Labs Reviewed  URINALYSIS, COMPLETE ([place]) WITH MICROSCOPIC - Abnormal; Notable for the following:       Result Value   Color, Urine YELLOW (*)    APPearance CLEAR (*)    All other components within normal limits  BASIC METABOLIC PANEL - Abnormal; Notable for the following:    Glucose, Bld 114 (*)    All other components within normal limits  CBC   ____________________________________________  EKG  none ____________________________________________  RADIOLOGY  US renal ____________________________________________   PROCEDURES  Procedure(s) performed: None  Procedures  Critical Care performed: No  ____________________________________________   INITIAL IMPRESSION / ASSESSMENT AND PLAN / ED COURSE  Pertinent labs & imaging results that were available during my care of the patient were reviewed by me and considered in my medical decision making (see chart for details).  This is a 50 year old male who comes into the hospital today with some right flank pain. The patient had a CT scan a few months ago so I chose to send him for an ultrasound looking for hydronephrosis. The patient's blood work is  unremarkable. I will give him a dose of morphine, Toradol and a Lidoderm patch to his back. He will be reassessed once I received the results of his imaging studies.  Clinical Course as of Apr 28 702  Wed Apr 28, 2016  [id number] No hydronephrosis or echogenic stone.  Solid lesion with internal vascularity along the posterior wall of the urinary bladder on the left concerning for a neoplastic process. Further evaluation with cystoscopy is recommended.   US Renal [AW]    Clinical Course User Index [AW] Loney Hering, MD    The patient's pain is improved. His ultrasound does not show any hydronephrosis but he does have a lesion along the posterior wall of his bladder. The concern is for malignancy. I discussed this with the patient and informed him that he does need to follow-up with urology. Since the patient's pain is controlled I will discharge him to home. The patient should return if he has any  other complaints or concerns. The patient understands the plans as stated. ____________________________________________   FINAL CLINICAL IMPRESSION(S) / ED DIAGNOSES  Final diagnoses:  Flank pain  Urinary frequency  Bladder mass      NEW MEDICATIONS STARTED DURING THIS VISIT:  Discharge Medication List as of 04/28/2016  4:26 AM    START taking these medications   Details  !! ondansetron (ZOFRAN ODT) 4 MG disintegrating tablet Take 1 tablet (4 mg total) by mouth every 8 (eight) hours as needed for nausea or vomiting., Starting Wed 04/28/2016, Print    !! oxyCODONE-acetaminophen (ROXICET) 5-325 MG tablet Take 1 tablet by mouth every 6 (six) hours as needed., Starting Wed 04/28/2016, Print    !! tamsulosin (FLOMAX) 0.4 MG CAPS capsule Take 1 capsule (0.4 mg total) by mouth daily., Starting Wed 04/28/2016, Print     !! - Potential duplicate medications found. Please discuss with provider.       Note:  This document was prepared using Dragon voice recognition software and may  include unintentional dictation errors.    Loney Hering, MD 04/28/16 [phone]

## 2016-04-28 NOTE — ED Notes (Signed)
Report off to vanessa rn 

## 2016-04-29 ENCOUNTER — Ambulatory Visit (INDEPENDENT_AMBULATORY_CARE_PROVIDER_SITE_OTHER): Payer: Self-pay | Admitting: Urology

## 2016-04-29 ENCOUNTER — Encounter: Payer: Self-pay | Admitting: Urology

## 2016-04-29 DIAGNOSIS — N329 Bladder disorder, unspecified: Secondary | ICD-10-CM

## 2016-04-29 LAB — MICROSCOPIC EXAMINATION

## 2016-04-29 LAB — URINALYSIS, COMPLETE
Bilirubin, UA: NEGATIVE
GLUCOSE, UA: NEGATIVE
KETONES UA: NEGATIVE
Leukocytes, UA: NEGATIVE
NITRITE UA: NEGATIVE
Protein, UA: NEGATIVE
RBC, UA: NEGATIVE
SPEC GRAV UA: 1.025 (ref 1.005–1.030)
UUROB: 1 mg/dL (ref 0.2–1.0)
pH, UA: 5.5 (ref 5.0–7.5)

## 2016-04-29 MED ORDER — CIPROFLOXACIN HCL 500 MG PO TABS
500.0000 mg | ORAL_TABLET | Freq: Once | ORAL | Status: AC
  Administered 2016-04-29: 500 mg via ORAL

## 2016-04-29 MED ORDER — LIDOCAINE HCL 2 % EX GEL
1.0000 "application " | Freq: Once | CUTANEOUS | Status: AC
  Administered 2016-04-29: 1 via URETHRAL

## 2016-04-29 NOTE — Progress Notes (Signed)
04/29/2016 3:20 PM   Carlos Holland 1965/09/16 [id number]  Referring provider: No referring provider defined for this encounter.  Chief Complaint  Patient presents with  . New Patient (Initial Visit)    bladder lesion     HPI: The patient is a 50 year old gentleman with a past medical history of nephrolithiasis who presents today for follow-up of an incidental bladder lesion seen on bladder ultrasound.  This lesion was seen on a renal ultrasound performed in December 2017 for suspected obstructive uropathy. It showed no hydronephrosis but this lesion was an incidental finding. It is located near the left UVJ. It does have flow. This hard to tell if it is a fold of bladder versus an actual lesion. He had a CT stone protocol in August 2017 which was unremarkable for an obvious bladder lesion or nephrolithiasis. Urinalysis performed yesterday in the emergency department was negative for hematuria or infection.  The patient has passed stones previously. He has had hematuria we'll passing these stones but otherwise has never seen gross hematuria outside of these episodes. He does have a history of smoking. He has no personal history of bladder or renal cancer. He has no other complaints at this time and is most concerned about the ultrasound finding.   PMH: No past medical history on file.  Surgical History: Past Surgical History:  Procedure Laterality Date  . APPENDECTOMY    . KNEE SURGERY Left     Home Medications:    Medication List       Accurate as of 04/29/16  3:20 PM. Always use your most recent med list.          ondansetron 4 MG disintegrating tablet Commonly known as:  ZOFRAN ODT Take 1 tablet (4 mg total) by mouth every 8 (eight) hours as needed for nausea or vomiting.   oxyCODONE-acetaminophen 5-325 MG tablet Commonly known as:  ROXICET Take 1 tablet by mouth every 6 (six) hours as needed.   tamsulosin 0.4 MG Caps capsule Commonly known as:  FLOMAX Take 1  capsule (0.4 mg total) by mouth daily.       Allergies:  Allergies  Allergen Reactions  . Bee Venom Anaphylaxis  . Penicillins Anaphylaxis  . Tramadol Rash    Family History: Family History  Problem Relation Age of Onset  . Diabetes Mother   . Prostate cancer Father     Social History:  reports that he has been smoking Cigarettes.  He has been smoking about 1.50 packs per day. He has never used smokeless tobacco. He reports that he does not drink alcohol or use drugs.  ROS: UROLOGY Frequent Urination?: Yes Hard to postpone urination?: Yes Burning/pain with urination?: Yes Get up at night to urinate?: Yes Leakage of urine?: No Urine stream starts and stops?: Yes Trouble starting stream?: Yes Do you have to strain to urinate?: Yes Blood in urine?: No Urinary tract infection?: No Sexually transmitted disease?: No Injury to kidneys or bladder?: No Painful intercourse?: No Weak stream?: Yes Erection problems?: Yes Penile pain?: Yes  Gastrointestinal Nausea?: Yes Vomiting?: No Indigestion/heartburn?: No Diarrhea?: No Constipation?: Yes  Constitutional Fever: No Night sweats?: No Weight loss?: No Fatigue?: No  Skin Skin rash/lesions?: No Itching?: No  Eyes Blurred vision?: No Double vision?: No  Ears/Nose/Throat Sore throat?: No Sinus problems?: Yes  Hematologic/Lymphatic Swollen glands?: No Easy bruising?: No  Cardiovascular Leg swelling?: No Chest pain?: No  Respiratory Cough?: No Shortness of breath?: No  Endocrine Excessive thirst?: Yes  Musculoskeletal Back  pain?: Yes Joint pain?: No  Neurological Headaches?: Yes Dizziness?: No  Psychologic Depression?: No Anxiety?: No  Physical Exam: There were no vitals taken for this visit.  Constitutional:  Alert and oriented, No acute distress. HEENT: Green AT, moist mucus membranes.  Trachea midline, no masses. Cardiovascular: No clubbing, cyanosis, or edema. Respiratory: Normal  respiratory effort, no increased work of breathing. GI: Abdomen is soft, nontender, nondistended, no abdominal masses GU: No CVA tenderness.  Skin: No rashes, bruises or suspicious lesions. Lymph: No cervical or inguinal adenopathy. Neurologic: Grossly intact, no focal deficits, moving all 4 extremities. Psychiatric: Normal mood and affect.  Laboratory Data: Lab Results  Component Value Date   WBC 10.0 04/27/2016   HGB 15.0 04/27/2016   HCT 43.5 04/27/2016   MCV 94.1 04/27/2016   PLT 175 04/27/2016    Lab Results  Component Value Date   CREATININE 1.11 04/27/2016    No results found for: PSA  No results found for: TESTOSTERONE  No results found for: HGBA1C  Urinalysis    Component Value Date/Time   COLORURINE YELLOW (A) 04/27/2016 2220   APPEARANCEUR CLEAR (A) 04/27/2016 2220   APPEARANCEUR Clear 08/30/2014 1509   LABSPEC 1.018 04/27/2016 2220   LABSPEC 1.020 08/30/2014 1509   PHURINE 5.0 04/27/2016 2220   GLUCOSEU NEGATIVE 04/27/2016 2220   GLUCOSEU Negative 08/30/2014 1509   HGBUR NEGATIVE 04/27/2016 2220   BILIRUBINUR NEGATIVE 04/27/2016 2220   BILIRUBINUR Negative 08/30/2014 1509   KETONESUR NEGATIVE 04/27/2016 2220   PROTEINUR NEGATIVE 04/27/2016 2220   NITRITE NEGATIVE 04/27/2016 2220   LEUKOCYTESUR NEGATIVE 04/27/2016 2220   LEUKOCYTESUR Negative 08/30/2014 1509    Cystoscopy Procedure Note  Patient identification was confirmed, informed consent was obtained, and patient was prepped using Betadine solution.  Lidocaine jelly was administered per urethral meatus.    Preoperative abx where received prior to procedure.     Pre-Procedure: - Inspection reveals a normal caliber ureteral meatus.  Procedure: The flexible cystoscope was introduced without difficulty - No urethral strictures/lesions are present. - Normal prostate  - Normal bladder neck - Bilateral ureteral orifices are unable to be identified due median lobe - No bladder stones - No  trabeculation  Retroflexion shows 3 cm exophytic lesion near trigone/bladder. Difficult to assess relative location to UO due to median lobe. Unable to see this lesion without retroflexion.   Post-Procedure: - Patient tolerated the procedure well   Pertinent Imaging: Reviewed as above  Assessment & Plan:   1. 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 We discussed the next step would be transurethral resection of the bladder tumor in the operating room. He understands that the results of this procedure would determine need for any additional operations or interventions. We discussed the risks, benefits, and indications of this procedure. He understands the risks include bleeding, infection, bladder perforation, need for repeat procedures, need for postoperative hospitalization, and post operative foley cahteter. We did also discuss that he has a large median lobe and that there is a small chance that this would need to be resected in order to visualize this tumor. Hopefully using a rigid cystoscope will allow direct visualization of this tumor. All questions were answered. The patient has elected to proceed.  Nickie Retort, MD  [facility] [address], [place], [place] [address] [phone]

## 2016-04-30 ENCOUNTER — Telehealth: Payer: Self-pay | Admitting: Radiology

## 2016-04-30 ENCOUNTER — Other Ambulatory Visit: Payer: Self-pay | Admitting: Radiology

## 2016-04-30 DIAGNOSIS — D494 Neoplasm of unspecified behavior of bladder: Secondary | ICD-10-CM

## 2016-04-30 NOTE — Telephone Encounter (Signed)
LMOM. Need to discuss surgery.

## 2016-05-01 LAB — URINE CULTURE: Organism ID, Bacteria: NO GROWTH

## 2016-05-03 NOTE — Telephone Encounter (Signed)
Notified pt of surgery scheduled with Dr Junious Silk on 05/21/16, pre-admit testing appt on 05/11/16 @9 :00 & to call day prior to surgery for arrival time to SDS. Pt voices understanding.

## 2016-05-11 ENCOUNTER — Encounter
Admission: RE | Admit: 2016-05-11 | Discharge: 2016-05-11 | Disposition: A | Discharge status: Home / Self Care | Payer: Self-pay | Source: Ambulatory Visit | Attending: Urology | Admitting: Urology

## 2016-05-11 DIAGNOSIS — Z01812 Encounter for preprocedural laboratory examination: Secondary | ICD-10-CM | POA: Insufficient documentation

## 2016-05-11 DIAGNOSIS — N329 Bladder disorder, unspecified: Secondary | ICD-10-CM | POA: Insufficient documentation

## 2016-05-11 HISTORY — DX: Personal history of urinary calculi: Z87.442

## 2016-05-11 HISTORY — DX: Inflammatory liver disease, unspecified: K75.9

## 2016-05-11 LAB — BASIC METABOLIC PANEL
ANION GAP: 5 (ref 5–15)
BUN: 15 mg/dL (ref 6–20)
CALCIUM: 9.3 mg/dL (ref 8.9–10.3)
CHLORIDE: 108 mmol/L (ref 101–111)
CO2: 26 mmol/L (ref 22–32)
Creatinine, Ser: 0.93 mg/dL (ref 0.61–1.24)
GFR calc non Af Amer: >60 mL/min (ref >60)
Glucose, Bld: 107 mg/dL — ABNORMAL HIGH (ref 65–99)
POTASSIUM: 4.5 mmol/L (ref 3.5–5.1)
Sodium: 139 mmol/L (ref 135–145)

## 2016-05-11 LAB — CBC
HEMATOCRIT: 44.5 % (ref 40.0–52.0)
HEMOGLOBIN: 15 g/dL (ref 13.0–18.0)
MCH: 31.9 pg (ref 26.0–34.0)
MCHC: 33.6 g/dL (ref 32.0–36.0)
MCV: 94.9 fL (ref 80.0–100.0)
Platelets: 169 10*3/uL (ref 150–440)
RBC: 4.69 MIL/uL (ref 4.40–5.90)
RDW: 13.9 % (ref 11.5–14.5)
WBC: 10.7 10*3/uL — ABNORMAL HIGH (ref 3.8–10.6)

## 2016-05-11 LAB — PROTIME-INR
INR: 0.94
PROTHROMBIN TIME: 12.6 s (ref 11.4–15.2)

## 2016-05-11 LAB — APTT: aPTT: 27 seconds (ref 24–36)

## 2016-05-11 NOTE — Patient Instructions (Signed)
Your procedure is scheduled on: May 22, 2015 Presntese a: 2nd [place] DOOR To find out your arrival time please call [phone] between [place] on May 21, 2015  Para saber su hora de llegada por favor llame al [phone] entre la 1PM - 3PM el da:  Remember: Instructions that are not followed completely may result in serious medical risk, up to and including death, or upon the discretion of your surgeon and anesthesiologist your surgery may need to be rescheduled.  Recuerde: Las instrucciones que no se siguen completamente Heritage manager en un riesgo de salud grave, incluyendo hasta la [place] o a discrecin de su cirujano y Environmental health practitioner, su ciruga se puede posponer.   __X__ 1. Do not eat food or drink liquids after midnight. No gum chewing or hard candies.  No coma alimentos ni tome lquidos despus de la medianoche.  No mastique chicle ni caramelos  duros.     __X__ 2. No alcohol for 24 hours before or after surgery.    No tome alcohol durante las 24 horas antes ni despus de la [place].   __X__ 3. Bring all medications with you on the day of surgery if instructed. BRING ANY NEW MEDICATIONS    Lleve todos los medicamentos con usted el da de su ciruga si se le ha indicado as.   __X__ 4. Notify your doctor if there is any change in your medical condition (cold, fever,                             infections).    Informe a su mdico si hay algn cambio en su condicin mdica (resfriado, fiebre, infecciones).   Do not wear jewelry, make-up, hairpins, clips or nail polish.  No use joyas, maquillajes, pinzas/ganchos para el cabello ni esmalte de uas.  Do not wear lotions, powders, or perfumes. You may wear deodorant.  No use lociones, polvos o perfumes.  Puede usar desodorante.    Do not shave 48 hours prior to surgery. Men may shave face and neck.  No se afeite 48 horas antes de la [place].  Los hombres pueden [employer] cara y el  cuello.   Do not bring valuables to the hospital.   No lleve objetos [facility] is not responsible for any belongings or valuables.  [place] no se hace responsable de ningn tipo de pertenencias u objetos de Geographical information systems officer.               Contacts, dentures or bridgework may not be worn into surgery.  Los lentes de [place], las dentaduras postizas o puentes no se pueden usar en la [place].  Leave your suitcase in the car. After surgery it may be brought to your room.  Deje su maleta en el auto.  Despus de la ciruga podr traerla a su habitacin.  For patients admitted to the hospital, discharge time is determined by your treatment team.  Para los pacientes que sean ingresados al hospital, el tiempo en el cual se le dar de alta es determinado por su                equipo de [place].   Patients discharged the day of surgery will not be allowed to drive home. A los pacientes que se les da de alta el mismo da de la ciruga no se les permitir conducir a Holiday representative.  Please read over the following fact sheets that you were given: Por favor [place] [facility] informacin que le dieron:   Chg INSTRUCTIONS  __X__ Take these medicines the morning of surgery with A SIP OF WATER:          [employer] estas medicinas la maana de la ciruga con UN SORBO DE AGUA:  1. NONE  2.   3.   4.       5.  6.  ____ Fleet Enema (as directed)          Enema de Fleet (segn lo indicado)    __X__ Use CHG Soap as directed          Utilice el jabn de CHG segn lo indicado  ____ Use inhalers on the day of surgery          Use los inhaladores el da de la ciruga  ____ Stop metformin 2 days prior to surgery          Deje de tomar el metformin 2 das antes de la ciruga    ____ Take 1/2 of usual insulin dose the night before surgery and none on the morning of surgery           Tome la mitad de la dosis habitual de insulina la noche antes de la [place] y no tome nada en la maana de  la             ciruga  ____ Stop Coumadin/Plavix/aspirin on           Deje de tomar el Coumadin/Plavix/aspirina el da:  __X__ Stop Anti-inflammatories on May 14, 2016 USE ONLY TYLENOL.          Deje de tomar Regulatory affairs officer:   ____ Stop supplements until after surgery            Deje de tomar suplementos hasta despus de la ciruga  ____ Bring C-Pap to the hospital          [place] al hospital

## 2016-05-13 ENCOUNTER — Other Ambulatory Visit: Payer: Self-pay

## 2016-05-21 ENCOUNTER — Ambulatory Visit: Payer: Self-pay | Admitting: Anesthesiology

## 2016-05-21 ENCOUNTER — Encounter: Payer: Self-pay | Admitting: *Deleted

## 2016-05-21 ENCOUNTER — Encounter
Admission: RE | Disposition: A | Discharge status: Home / Self Care | Payer: Self-pay | Source: Ambulatory Visit | Attending: Urology

## 2016-05-21 ENCOUNTER — Ambulatory Visit
Admission: RE | Admit: 2016-05-21 | Discharge: 2016-05-21 | Disposition: A | Discharge status: Home / Self Care | Payer: Self-pay | Source: Ambulatory Visit | Attending: Urology | Admitting: Urology

## 2016-05-21 ENCOUNTER — Telehealth: Payer: Self-pay

## 2016-05-21 DIAGNOSIS — Z880 Allergy status to penicillin: Secondary | ICD-10-CM | POA: Insufficient documentation

## 2016-05-21 DIAGNOSIS — Z87442 Personal history of urinary calculi: Secondary | ICD-10-CM | POA: Insufficient documentation

## 2016-05-21 DIAGNOSIS — Z91030 Bee allergy status: Secondary | ICD-10-CM | POA: Insufficient documentation

## 2016-05-21 DIAGNOSIS — Z885 Allergy status to narcotic agent status: Secondary | ICD-10-CM | POA: Insufficient documentation

## 2016-05-21 DIAGNOSIS — B1920 Unspecified viral hepatitis C without hepatic coma: Secondary | ICD-10-CM | POA: Insufficient documentation

## 2016-05-21 DIAGNOSIS — D494 Neoplasm of unspecified behavior of bladder: Secondary | ICD-10-CM

## 2016-05-21 DIAGNOSIS — N358 Other urethral stricture: Secondary | ICD-10-CM | POA: Insufficient documentation

## 2016-05-21 DIAGNOSIS — C675 Malignant neoplasm of bladder neck: Secondary | ICD-10-CM | POA: Insufficient documentation

## 2016-05-21 DIAGNOSIS — N500 Atrophy of testis: Secondary | ICD-10-CM | POA: Insufficient documentation

## 2016-05-21 DIAGNOSIS — F17210 Nicotine dependence, cigarettes, uncomplicated: Secondary | ICD-10-CM | POA: Insufficient documentation

## 2016-05-21 HISTORY — PX: TRANSURETHRAL RESECTION OF BLADDER TUMOR: SHX2575

## 2016-05-21 HISTORY — PX: CYSTOSCOPY W/ RETROGRADES: SHX1426

## 2016-05-21 LAB — URINE DRUG SCREEN, QUALITATIVE (ARMC ONLY)
AMPHETAMINES, UR SCREEN: NOT DETECTED
BENZODIAZEPINE, UR SCRN: NOT DETECTED
Barbiturates, Ur Screen: NOT DETECTED
Cannabinoid 50 Ng, Ur ~~LOC~~: NOT DETECTED
Cocaine Metabolite,Ur ~~LOC~~: NOT DETECTED
MDMA (Ecstasy)Ur Screen: NOT DETECTED
METHADONE SCREEN, URINE: NOT DETECTED
OPIATE, UR SCREEN: NOT DETECTED
Phencyclidine (PCP) Ur S: NOT DETECTED
Tricyclic, Ur Screen: NOT DETECTED

## 2016-05-21 SURGERY — TURBT (TRANSURETHRAL RESECTION OF BLADDER TUMOR)
Anesthesia: General

## 2016-05-21 MED ORDER — OXYCODONE-ACETAMINOPHEN 5-325 MG PO TABS
ORAL_TABLET | ORAL | Status: AC
  Filled 2016-05-21: qty 1

## 2016-05-21 MED ORDER — LIDOCAINE HCL 2 % EX GEL
CUTANEOUS | Status: AC
  Filled 2016-05-21: qty 5

## 2016-05-21 MED ORDER — CIPROFLOXACIN IN D5W 400 MG/200ML IV SOLN
INTRAVENOUS | Status: AC
  Filled 2016-05-21: qty 200

## 2016-05-21 MED ORDER — FAMOTIDINE 20 MG PO TABS
ORAL_TABLET | ORAL | Status: AC
  Filled 2016-05-21: qty 1

## 2016-05-21 MED ORDER — LACTATED RINGERS IV SOLN
INTRAVENOUS | Status: DC
  Administered 2016-05-21 (×2): via INTRAVENOUS

## 2016-05-21 MED ORDER — FENTANYL CITRATE (PF) 100 MCG/2ML IJ SOLN: INTRAMUSCULAR | Status: DC | PRN

## 2016-05-21 MED ORDER — ATROPINE SULFATE 0.4 MG/ML IV SOSY
PREFILLED_SYRINGE | INTRAVENOUS | Status: AC
  Filled 2016-05-21: qty 2.5

## 2016-05-21 MED ORDER — FENTANYL CITRATE (PF) 100 MCG/2ML IJ SOLN
INTRAMUSCULAR | Status: AC
  Administered 2016-05-21: 25 ug via INTRAVENOUS
  Filled 2016-05-21: qty 2

## 2016-05-21 MED ORDER — EPHEDRINE 5 MG/ML INJ
INTRAVENOUS | Status: AC
  Filled 2016-05-21: qty 10

## 2016-05-21 MED ORDER — FENTANYL CITRATE (PF) 100 MCG/2ML IJ SOLN
25.0000 ug | INTRAMUSCULAR | Status: AC | PRN
  Administered 2016-05-21 (×6): 25 ug via INTRAVENOUS

## 2016-05-21 MED ORDER — OXYCODONE-ACETAMINOPHEN 5-325 MG PO TABS
1.0000 | ORAL_TABLET | ORAL | Status: DC | PRN
  Administered 2016-05-21: 1 via ORAL

## 2016-05-21 MED ORDER — SODIUM CHLORIDE 0.9 % IV SOLN: 50.0000 mg | Freq: Once | INTRAVENOUS | Status: DC

## 2016-05-21 MED ORDER — ONDANSETRON HCL 4 MG/2ML IJ SOLN
INTRAMUSCULAR | Status: DC | PRN
  Administered 2016-05-21: 4 mg via INTRAVENOUS

## 2016-05-21 MED ORDER — OXYCODONE-ACETAMINOPHEN 5-325 MG PO TABS: 1.0000 | ORAL_TABLET | ORAL | 0 refills | Status: DC | PRN

## 2016-05-21 MED ORDER — PROPOFOL 10 MG/ML IV BOLUS
INTRAVENOUS | Status: AC
  Filled 2016-05-21: qty 20

## 2016-05-21 MED ORDER — FENTANYL CITRATE (PF) 100 MCG/2ML IJ SOLN
INTRAMUSCULAR | Status: DC | PRN
  Administered 2016-05-21: 50 ug via INTRAVENOUS
  Administered 2016-05-21 (×2): 25 ug via INTRAVENOUS

## 2016-05-21 MED ORDER — PHENYLEPHRINE 40 MCG/ML (10ML) SYRINGE FOR IV PUSH (FOR BLOOD PRESSURE SUPPORT)
PREFILLED_SYRINGE | INTRAVENOUS | Status: AC
  Filled 2016-05-21: qty 10

## 2016-05-21 MED ORDER — LIDOCAINE 2% (20 MG/ML) 5 ML SYRINGE
INTRAMUSCULAR | Status: AC
  Filled 2016-05-21: qty 5

## 2016-05-21 MED ORDER — FAMOTIDINE 20 MG PO TABS
20.0000 mg | ORAL_TABLET | Freq: Once | ORAL | Status: AC
Start: 2016-05-21 — End: 2016-05-21
  Administered 2016-05-21: 20 mg via ORAL

## 2016-05-21 MED ORDER — MIDAZOLAM HCL 2 MG/2ML IJ SOLN
INTRAMUSCULAR | Status: AC
  Filled 2016-05-21: qty 2

## 2016-05-21 MED ORDER — NITROFURANTOIN MACROCRYSTAL 100 MG PO CAPS: 100.0000 mg | ORAL_CAPSULE | Freq: Every day | ORAL | 0 refills | Status: AC

## 2016-05-21 MED ORDER — FENTANYL CITRATE (PF) 100 MCG/2ML IJ SOLN
INTRAMUSCULAR | Status: AC
  Filled 2016-05-21: qty 2

## 2016-05-21 MED ORDER — HYDROMORPHONE HCL 1 MG/ML IJ SOLN
INTRAMUSCULAR | Status: AC
  Administered 2016-05-21: 0.25 mg via INTRAVENOUS
  Filled 2016-05-21: qty 1

## 2016-05-21 MED ORDER — CIPROFLOXACIN IN D5W 400 MG/200ML IV SOLN
400.0000 mg | INTRAVENOUS | Status: AC
  Administered 2016-05-21: 400 mg via INTRAVENOUS

## 2016-05-21 MED ORDER — LIDOCAINE HCL (CARDIAC) 20 MG/ML IV SOLN
INTRAVENOUS | Status: DC | PRN
  Administered 2016-05-21: 90 mg via INTRAVENOUS

## 2016-05-21 MED ORDER — MIDAZOLAM HCL 2 MG/2ML IJ SOLN
INTRAMUSCULAR | Status: DC | PRN
  Administered 2016-05-21 (×2): 1 mg via INTRAVENOUS

## 2016-05-21 MED ORDER — HYDROMORPHONE HCL 1 MG/ML IJ SOLN
0.2500 mg | INTRAMUSCULAR | Status: DC | PRN
  Administered 2016-05-21: 0.5 mg via INTRAVENOUS
  Administered 2016-05-21 (×2): 0.25 mg via INTRAVENOUS

## 2016-05-21 MED ORDER — SUCCINYLCHOLINE CHLORIDE 200 MG/10ML IV SOSY
PREFILLED_SYRINGE | INTRAVENOUS | Status: AC
  Filled 2016-05-21: qty 10

## 2016-05-21 MED ORDER — FENTANYL CITRATE (PF) 100 MCG/2ML IJ SOLN
25.0000 ug | INTRAMUSCULAR | Status: AC | PRN
  Administered 2016-05-21 (×2): 25 ug via INTRAVENOUS

## 2016-05-21 MED ORDER — PROMETHAZINE HCL 25 MG/ML IJ SOLN: 6.2500 mg | INTRAMUSCULAR | Status: DC | PRN

## 2016-05-21 MED ORDER — PROPOFOL 10 MG/ML IV BOLUS
INTRAVENOUS | Status: DC | PRN
  Administered 2016-05-21: 50 mg via INTRAVENOUS
  Administered 2016-05-21: 30 mg via INTRAVENOUS
  Administered 2016-05-21: 180 mg via INTRAVENOUS
  Administered 2016-05-21: 20 mg via INTRAVENOUS

## 2016-05-21 MED ORDER — MITOMYCIN CHEMO FOR BLADDER INSTILLATION 40 MG
40.0000 mg | Freq: Once | INTRAVENOUS | Status: DC
  Filled 2016-05-21: qty 40

## 2016-05-21 MED ORDER — DEXAMETHASONE SODIUM PHOSPHATE 10 MG/ML IJ SOLN
INTRAMUSCULAR | Status: AC
  Filled 2016-05-21: qty 1

## 2016-05-21 SURGICAL SUPPLY — 31 items
BAG DRAIN CYSTO-URO LG1000N (MISCELLANEOUS) ×4 IMPLANT
BAG URO DRAIN 2000ML W/SPOUT (MISCELLANEOUS) ×4 IMPLANT
CATH FOL LX CONE TIP  8F (CATHETERS)
CATH FOL LX CONE TIP 8F (CATHETERS) IMPLANT
CATH FOLEY 2WAY  5CC 16FR (CATHETERS)
CATH URETL 5X70 OPEN END (CATHETERS) ×4 IMPLANT
CATH URTH 16FR FL 2W BLN LF (CATHETERS) IMPLANT
CONE TIP CATHETER 10FR IMPLANT
ELECT REM PT RETURN 9FT ADLT (ELECTROSURGICAL) ×4
ELECTRODE REM PT RTRN 9FT ADLT (ELECTROSURGICAL) ×2 IMPLANT
GLOVE BIO SURGEON STRL SZ7 (GLOVE) ×8 IMPLANT
GLOVE BIO SURGEON STRL SZ7.5 (GLOVE) ×4 IMPLANT
GOWN STRL REUS W/ TWL LRG LVL3 (GOWN DISPOSABLE) ×2 IMPLANT
GOWN STRL REUS W/ TWL XL LVL3 (GOWN DISPOSABLE) ×2 IMPLANT
GOWN STRL REUS W/TWL LRG LVL3 (GOWN DISPOSABLE) ×2
GOWN STRL REUS W/TWL XL LVL3 (GOWN DISPOSABLE) ×2
KIT RM TURNOVER CYSTO AR (KITS) ×4 IMPLANT
PACK CYSTO AR (MISCELLANEOUS) ×4 IMPLANT
PAD TELFA 2X3 NADH STRL (GAUZE/BANDAGES/DRESSINGS) ×4 IMPLANT
PREP PVP WINGED SPONGE (MISCELLANEOUS) ×4 IMPLANT
ROLLER BALL 3MM 24FR (ELECTROSURGICAL) IMPLANT
SET IRRIG Y TYPE TUR BLADDER L (SET/KITS/TRAYS/PACK) ×4 IMPLANT
SOL .9 NS 3000ML IRR  AL (IV SOLUTION) ×4
SOL .9 NS 3000ML IRR UROMATIC (IV SOLUTION) ×4 IMPLANT
SOL PREP PVP 2OZ (MISCELLANEOUS)
SOLUTION PREP PVP 2OZ (MISCELLANEOUS) IMPLANT
SURGILUBE 2OZ TUBE FLIPTOP (MISCELLANEOUS) ×4 IMPLANT
SYRINGE IRR TOOMEY STRL 70CC (SYRINGE) ×4 IMPLANT
TUBING CONNECTING 10 (TUBING) ×3 IMPLANT
TUBING CONNECTING 10' (TUBING) ×1
WATER STERILE IRR 1000ML POUR (IV SOLUTION) ×4 IMPLANT

## 2016-05-21 NOTE — Discharge Instructions (Signed)
Foley Catheter Care, Adult °A Foley catheter is a soft, flexible tube. This tube is placed into your bladder to drain pee (urine). If you go home with this catheter in place, follow the instructions below. °TAKING CARE OF THE CATHETER °1. Wash your hands with soap and water. °2. Put soap and water on a clean washcloth. °¨ Clean the skin where the tube goes into your body. °§ Clean away from the tube site. °§ Never wipe toward the tube. °§ Clean the area using a circular motion. °¨ Remove all the soap. Pat the area dry with a clean towel. For males, reposition the skin that covers the end of the penis (foreskin). °3. Attach the tube to your leg with tape or a leg strap. Do not stretch the tube tight. If you are using tape, remove any stickiness left behind by past tape you used. °4. Keep the drainage bag below your hips. Keep it off the floor. °5. Check your tube during the day. Make sure it is working and draining. Make sure the tube does not curl, twist, or bend. °6. Do not pull on the tube or try to take it out. °TAKING CARE OF THE DRAINAGE BAGS °You will have a large overnight drainage bag and a small leg bag. You may wear the overnight bag any time. Never wear the small bag at night. Follow the directions below. °Emptying the Drainage Bag  °Empty your drainage bag when it is ?-½ full or at least 2-3 times a day. °1. Wash your hands with soap and water. °2. Keep the drainage bag below your hips. °3. Hold the dirty bag over the toilet or clean container. °4. Open the pour spout at the bottom of the bag. Empty the pee into the toilet or container. Do not let the pour spout touch anything. °5. Clean the pour spout with a gauze pad or cotton ball that has rubbing alcohol on it. °6. Close the pour spout. °7. Attach the bag to your leg with tape or a leg strap. °8. Wash your hands well. °Changing the Drainage Bag  °Change your bag once a month or sooner if it starts to smell or look dirty.  °1. Wash your hands with  soap and water. °2. Pinch the rubber tube so that pee does not spill out. °3. Disconnect the catheter tube from the drainage tube at the connection valve. Do not let the tubes touch anything. °4. Clean the end of the catheter tube with an alcohol wipe. Clean the end of a the drainage tube with a different alcohol wipe. °5. Connect the catheter tube to the drainage tube of the clean drainage bag. °6. Attach the new bag to the leg with tape or a leg strap. Avoid attaching the new bag too tightly. °7. Wash your hands well. °Cleaning the Drainage Bag  °1. Wash your hands with soap and water. °2. Wash the bag in warm, soapy water. °3. Rinse the bag with warm water. °4. Fill the bag with a mixture of white vinegar and water (1 cup vinegar to 1 quart warm water [.2 liter vinegar to 1 liter warm water]). Close the bag and soak it for 30 minutes in the solution. °5. Rinse the bag with warm water. °6. Hang the bag to dry with the pour spout open and hanging downward. °7. Store the clean bag (once it is dry) in a clean plastic bag. °8. Wash your hands well. °PREVENT INFECTION °· Wash your hands before and after touching   your tube.  Take showers every day. Wash the skin where the tube enters your body. Do not take baths. Replace wet leg straps with dry ones, if this applies.  Do not use powders, sprays, or lotions on the genital area. Only use creams, lotions, or ointments as told by your doctor.  For females, wipe from front to back after going to the bathroom.  Drink enough fluids to keep your pee clear or pale yellow unless you are told not to have too much fluid (fluid restriction).  Do not let the drainage bag or tubing touch or lie on the floor.  Wear cotton underwear to keep the area dry. GET HELP IF:  Your pee is cloudy or smells unusually bad.  Your tube becomes clogged.  You are not draining pee into the bag or your bladder feels full.  Your tube starts to leak. GET HELP RIGHT AWAY IF:  You  have pain, puffiness (swelling), redness, or yellowish-white fluid (pus) where the tube enters the body.  You have pain in the belly (abdomen), legs, lower back, or bladder.  You have a fever.  You see blood fill the tube, or your pee is pink or red.  You feel sick to your stomach (nauseous), throw up (vomit), or have chills.  Your tube gets pulled out. MAKE SURE YOU:   Understand these instructions.  Will watch your condition.  Will get help right away if you are not doing well or get worse. This information is not intended to replace advice given to you by your health care provider. Make sure you discuss any questions you have with your health care provider. Document Released: 08/28/2012 Document Revised: 05/24/2014 Document Reviewed: 04/19/2015 Elsevier Interactive Patient Education  2017 [place]   1) The drugs that you were given will stay in your system until tomorrow so for the next 24 hours you should not:  A) Drive an automobile B) Make any legal decisions C) Drink any alcoholic beverage   2) You may resume regular meals tomorrow.  Today it is better to start with liquids and gradually work up to solid foods.  You may eat anything you prefer, but it is better to start with liquids, then soup and crackers, and gradually work up to solid foods.   3) Please notify your doctor immediately if you have any unusual bleeding, trouble breathing, redness and pain at the surgery site, drainage, fever, or pain not relieved by medication.    4) Additional [place]   DRINK   Please contact your physician with any problems or Same Day Surgery at [phone], Monday through Friday 6 am to 4 pm,                                          or [place] at [facility] number at [phone].

## 2016-05-21 NOTE — OR Nursing (Signed)
Reviewed foley care and exchange of catheters with patient and his wife. Provided several written documents with photos and instructions to help them if they forget what to do once home.  Patient states that he feels comfortable exchanging the leg bag into the larger catheter at bedtime. Overall discomfort has improved but still feels as if he has to void.  Urine remains blood tinged.  Urine output adequate .Marland KitchenMarland KitchenMarland Kitchen 250cc in 2 hours.

## 2016-05-21 NOTE — Interval H&P Note (Signed)
History and Physical Interval Note:  05/21/2016 7:47 AM  Carlos Holland  has presented today for surgery, with the diagnosis of BLADDER LESION  The various methods of treatment have been discussed with the patient and family. After consideration of risks, benefits and other options for treatment, the patient has consented to  Procedure(s): TRANSURETHRAL RESECTION OF BLADDER TUMOR (TURBT) (N/A), possible TURP,  bilateral retrograde pyelograms as a surgical intervention .  The patient's history has been reviewed, patient examined, no change in status, stable for surgery.  I have reviewed the patient's chart, CT, U/S,  and labs. I discussed with the patient and his family the nature, potential benefits, risks and alternatives to TURBT possible TURP (given median lobe), including side effects of the proposed treatment, the likelihood of the patient achieving the goals of the procedure, and any potential problems that might occur during the procedure or recuperation. Discussed risks of bleeding, infection, perforation, incontinence and stricture among others. All questions answered. Patient elects to proceed.     Mitesh Rosendahl

## 2016-05-21 NOTE — H&P (View-Only) (Signed)
04/29/2016 3:20 PM   Carlos Holland 10-06-1965 [id number]  Referring provider: No referring provider defined for this encounter.  Chief Complaint  Patient presents with  . New Patient (Initial Visit)    bladder lesion     HPI: The patient is a 51 year old gentleman with a past medical history of nephrolithiasis who presents today for follow-up of an incidental bladder lesion seen on bladder ultrasound.  This lesion was seen on a renal ultrasound performed in December 2017 for suspected obstructive uropathy. It showed no hydronephrosis but this lesion was an incidental finding. It is located near the left UVJ. It does have flow. This hard to tell if it is a fold of bladder versus an actual lesion. He had a CT stone protocol in August 2017 which was unremarkable for an obvious bladder lesion or nephrolithiasis. Urinalysis performed yesterday in the emergency department was negative for hematuria or infection.  The patient has passed stones previously. He has had hematuria we'll passing these stones but otherwise has never seen gross hematuria outside of these episodes. He does have a history of smoking. He has no personal history of bladder or renal cancer. He has no other complaints at this time and is most concerned about the ultrasound finding.   PMH: No past medical history on file.  Surgical History: Past Surgical History:  Procedure Laterality Date  . APPENDECTOMY    . KNEE SURGERY Left     Home Medications:    Medication List       Accurate as of 04/29/16  3:20 PM. Always use your most recent med list.          ondansetron 4 MG disintegrating tablet Commonly known as:  ZOFRAN ODT Take 1 tablet (4 mg total) by mouth every 8 (eight) hours as needed for nausea or vomiting.   oxyCODONE-acetaminophen 5-325 MG tablet Commonly known as:  ROXICET Take 1 tablet by mouth every 6 (six) hours as needed.   tamsulosin 0.4 MG Caps capsule Commonly known as:  FLOMAX Take 1  capsule (0.4 mg total) by mouth daily.       Allergies:  Allergies  Allergen Reactions  . Bee Venom Anaphylaxis  . Penicillins Anaphylaxis  . Tramadol Rash    Family History: Family History  Problem Relation Age of Onset  . Diabetes Mother   . Prostate cancer Father     Social History:  reports that he has been smoking Cigarettes.  He has been smoking about 1.50 packs per day. He has never used smokeless tobacco. He reports that he does not drink alcohol or use drugs.  ROS: UROLOGY Frequent Urination?: Yes Hard to postpone urination?: Yes Burning/pain with urination?: Yes Get up at night to urinate?: Yes Leakage of urine?: No Urine stream starts and stops?: Yes Trouble starting stream?: Yes Do you have to strain to urinate?: Yes Blood in urine?: No Urinary tract infection?: No Sexually transmitted disease?: No Injury to kidneys or bladder?: No Painful intercourse?: No Weak stream?: Yes Erection problems?: Yes Penile pain?: Yes  Gastrointestinal Nausea?: Yes Vomiting?: No Indigestion/heartburn?: No Diarrhea?: No Constipation?: Yes  Constitutional Fever: No Night sweats?: No Weight loss?: No Fatigue?: No  Skin Skin rash/lesions?: No Itching?: No  Eyes Blurred vision?: No Double vision?: No  Ears/Nose/Throat Sore throat?: No Sinus problems?: Yes  Hematologic/Lymphatic Swollen glands?: No Easy bruising?: No  Cardiovascular Leg swelling?: No Chest pain?: No  Respiratory Cough?: No Shortness of breath?: No  Endocrine Excessive thirst?: Yes  Musculoskeletal Back  pain?: Yes Joint pain?: No  Neurological Headaches?: Yes Dizziness?: No  Psychologic Depression?: No Anxiety?: No  Physical Exam: There were no vitals taken for this visit.  Constitutional:  Alert and oriented, No acute distress. HEENT: [place] AT, moist mucus membranes.  Trachea midline, no masses. Cardiovascular: No clubbing, cyanosis, or edema. Respiratory: Normal  respiratory effort, no increased work of breathing. GI: Abdomen is soft, nontender, nondistended, no abdominal masses GU: No CVA tenderness.  Skin: No rashes, bruises or suspicious lesions. Lymph: No cervical or inguinal adenopathy. Neurologic: Grossly intact, no focal deficits, moving all 4 extremities. Psychiatric: Normal mood and affect.  Laboratory Data: Lab Results  Component Value Date   WBC 10.0 04/27/2016   HGB 15.0 04/27/2016   HCT 43.5 04/27/2016   MCV 94.1 04/27/2016   PLT 175 04/27/2016    Lab Results  Component Value Date   CREATININE 1.11 04/27/2016    No results found for: PSA  No results found for: TESTOSTERONE  No results found for: HGBA1C  Urinalysis    Component Value Date/Time   COLORURINE YELLOW (A) 04/27/2016 2220   APPEARANCEUR CLEAR (A) 04/27/2016 2220   APPEARANCEUR Clear 08/30/2014 1509   LABSPEC 1.018 04/27/2016 2220   LABSPEC 1.020 08/30/2014 1509   PHURINE 5.0 04/27/2016 2220   GLUCOSEU NEGATIVE 04/27/2016 2220   GLUCOSEU Negative 08/30/2014 1509   HGBUR NEGATIVE 04/27/2016 2220   BILIRUBINUR NEGATIVE 04/27/2016 2220   BILIRUBINUR Negative 08/30/2014 1509   KETONESUR NEGATIVE 04/27/2016 2220   PROTEINUR NEGATIVE 04/27/2016 2220   NITRITE NEGATIVE 04/27/2016 2220   LEUKOCYTESUR NEGATIVE 04/27/2016 2220   LEUKOCYTESUR Negative 08/30/2014 1509    Cystoscopy Procedure Note  Patient identification was confirmed, informed consent was obtained, and patient was prepped using Betadine solution.  Lidocaine jelly was administered per urethral meatus.    Preoperative abx where received prior to procedure.     Pre-Procedure: - Inspection reveals a normal caliber ureteral meatus.  Procedure: The flexible cystoscope was introduced without difficulty - No urethral strictures/lesions are present. - Normal prostate  - Normal bladder neck - Bilateral ureteral orifices are unable to be identified due median lobe - No bladder stones - No  trabeculation  Retroflexion shows 3 cm exophytic lesion near trigone/bladder. Difficult to assess relative location to UO due to median lobe. Unable to see this lesion without retroflexion.   Post-Procedure: - Patient tolerated the procedure well   Pertinent Imaging: Reviewed as above  Assessment & Plan:   1. 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 We discussed the next step would be transurethral resection of the bladder tumor in the operating room. He understands that the results of this procedure would determine need for any additional operations or interventions. We discussed the risks, benefits, and indications of this procedure. He understands the risks include bleeding, infection, bladder perforation, need for repeat procedures, need for postoperative hospitalization, and post operative foley cahteter. We did also discuss that he has a large median lobe and that there is a small chance that this would need to be resected in order to visualize this tumor. Hopefully using a rigid cystoscope will allow direct visualization of this tumor. All questions were answered. The patient has elected to proceed.  Nickie Retort, MD  [facility] [address], [place], [place] [address] [phone]

## 2016-05-21 NOTE — Telephone Encounter (Signed)
-----   Message from Festus Aloe, MD sent at 05/21/2016  9:11 AM EST ----- Pt s/p TURBT, dilation of urethral stricture  -- he needs to follow-up Monday or Tuesday with PA or [place] for void trial/foley removal and in 7-10 days to review pathology with MD. Thanks.

## 2016-05-21 NOTE — Anesthesia Procedure Notes (Signed)
Procedure Name: LMA Insertion Date/Time: 05/21/2016 8:00 AM Performed by: Allean Found Pre-anesthesia Checklist: Patient identified, Emergency Drugs available, Suction available, Patient being monitored and Timeout performed Patient Re-evaluated:Patient Re-evaluated prior to inductionOxygen Delivery Method: Circle system utilized Preoxygenation: Pre-oxygenation with 100% oxygen Intubation Type: IV induction Ventilation: Mask ventilation without difficulty LMA Size: 5.0 Number of attempts: 1 Placement Confirmation: positive ETCO2 Tube secured with: Tape Dental Injury: Teeth and Oropharynx as per pre-operative assessment

## 2016-05-21 NOTE — Op Note (Signed)
Preoperative diagnosis: Bladder neoplasm Postoperative diagnosis: Same  Procedure: Exam under anesthesia, cystoscopy, bilateral retrograde pyelogram, TURBT 2-5 cm;  installation of epirubicin in PACU  Surgeon: Junious Silk  Anesthesia: Gen.  Indications for procedure: 51 year old with bladder mass noted on ultrasound and office cystoscopy.  Findings: On exam under anesthesia the penis was circumcised without mass or lesion. The testicles were soft and slightly atrophic but palpably normal without mass. On digital rectal exam the prostate was 25 g and benign. No hard areas or nodules.  On cystoscopy there was a papillary tumor at the left bladder neck. There were some faint papillary changes to the left side of the inferior and left prostate. There was a slightly high median bar but no significant lateral or median lobe hypertrophy. There were no stones or foreign bodies in the bladder. The trigone and ureteral orifices were in their normal orthotopic position with clear efflux.  Left retrograde pyelogram-this outlined a single ureter single collecting system unit without filling defect, stricture or dilation.  Right retrograde pyelogram-this outlined a single ureter single collecting system unit without filling defect, stricture or dilation.  Description of procedure: After consent was obtained patient brought to the operating room. After adequate anesthesia he was placed in lithotomy position and prepped and draped in the usual sterile fashion. An exam under anesthesia was performed. A timeout was performed to confirm the patient and procedure. The cystoscope was difficult to place due to a fossa navicularis stricture and required the obturator in the scope. There was also stricture in the bulbar urethra which the scope dilated. The bladder was inspected.   A 5 French open-ended catheter was used to cannulate the left ureteral orifice and left retrograde injection of contrast was performed with  spot imaging. The right ureteral orifice was cannulated and retrograde injection of contrast was performed with spot imaging.  I then had to dilate the fossa navicularis with sounds to 28 [place]. I was in just able to get the 26 French resectoscope sheath and with the visual obturator. The handle was then placed in the tumor was resected in its entirety to the muscle. It was on a more broad-based. This area was fulgurated. Hemostasis was excellent. The chips were drained. On exit and noticed some faint papillary changes at the left veru. I lightly fulgurated this. The scope was removed. Because of the depth of resection and dilation of the fossa I plan to leave a Foley catheter over the weekend. An 24 French catheter was placed with clear drainage urine. He was awakened and taken to the recovery room in stable condition.  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

## 2016-05-21 NOTE — Transfer of Care (Signed)
Immediate Anesthesia Transfer of Care Note  Patient: Carlos Holland  Procedure(s) Performed: Procedure(s): TRANSURETHRAL RESECTION OF BLADDER TUMOR (TURBT) (N/A) CYSTOSCOPY WITH RETROGRADE PYELOGRAM  Patient Location: PACU  Anesthesia Type:General  Level of Consciousness: awake  Airway & Oxygen Therapy: Patient Spontanous Breathing and Patient connected to face mask oxygen  Post-op Assessment: Report given to RN and Post -op Vital signs reviewed and stable  Post vital signs: Reviewed and stable  Last Vitals:  Vitals:   05/21/16 0607  BP: (!) 143/92  Pulse: (!) 59  Resp: 14  Temp: 36.1 C    Last Pain:  Vitals:   05/21/16 0607  TempSrc: Tympanic  PainSc: 7          Complications: No apparent anesthesia complications

## 2016-05-21 NOTE — Anesthesia Postprocedure Evaluation (Signed)
Anesthesia Post Note  Patient: Carlos Holland  Procedure(s) Performed: Procedure(s) (LRB): TRANSURETHRAL RESECTION OF BLADDER TUMOR (TURBT) (N/A) CYSTOSCOPY WITH RETROGRADE PYELOGRAM  Patient location during evaluation: PACU Anesthesia Type: General Level of consciousness: awake and alert Pain management: pain level controlled Vital Signs Assessment: post-procedure vital signs reviewed and stable Respiratory status: spontaneous breathing, nonlabored ventilation, respiratory function stable and patient connected to nasal cannula oxygen Cardiovascular status: blood pressure returned to baseline and stable Postop Assessment: no signs of nausea or vomiting Anesthetic complications: no     Last Vitals:  Vitals:   05/21/16 1055 05/21/16 1104  BP: (!) 145/102 (!) 155/95  Pulse: (!) 57 64  Resp: 20 18  Temp: 36.4 C 36.6 C    Last Pain:  Vitals:   05/21/16 1104  TempSrc: Temporal  PainSc: 6                  Martha Clan

## 2016-05-21 NOTE — Progress Notes (Signed)
Dr Audree Bane in to instill chemo  pugglied for 50 minutes

## 2016-05-21 NOTE — Anesthesia Preprocedure Evaluation (Signed)
Anesthesia Evaluation  Patient identified by MRN, date of birth, ID band Patient awake    Reviewed: Allergy & Precautions, H&P , NPO status , Patient's Chart, lab work & pertinent test results, reviewed documented beta blocker date and time   History of Anesthesia Complications Negative for: history of anesthetic complications  Airway Mallampati: I  TM Distance: >3 FB Neck ROM: full    Dental  (+) Missing, Poor Dentition, Dental Advidsory Given   Pulmonary neg shortness of breath, neg sleep apnea, neg COPD, neg recent URI, Current Smoker,    Pulmonary exam normal breath sounds clear to auscultation       Cardiovascular Exercise Tolerance: Good negative cardio ROS Normal cardiovascular exam Rhythm:regular Rate:Normal     Neuro/Psych negative neurological ROS  negative psych ROS   GI/Hepatic negative GI ROS, (+) Hepatitis -, C  Endo/Other  negative endocrine ROS  Renal/GU Renal disease (kidney stones)  negative genitourinary   Musculoskeletal   Abdominal   Peds  Hematology negative hematology ROS (+)   Anesthesia Other Findings Past Medical History: No date: Hepatitis     Comment: hepatitis c 01/2016: History of kidney stones   Reproductive/Obstetrics negative OB ROS                             Anesthesia Physical Anesthesia Plan  ASA: II  Anesthesia Plan: General   Post-op Pain Management:    Induction:   Airway Management Planned:   Additional Equipment:   Intra-op Plan:   Post-operative Plan:   Informed Consent: I have reviewed the patients History and Physical, chart, labs and discussed the procedure including the risks, benefits and alternatives for the proposed anesthesia with the patient or authorized representative who has indicated his/her understanding and acceptance.   Dental Advisory Given  Plan Discussed with: Anesthesiologist, CRNA and Surgeon  Anesthesia  Plan Comments:         Anesthesia Quick Evaluation

## 2016-05-21 NOTE — Progress Notes (Signed)
Pt looks good in PACU. A&O. Urine clear. I instilled mitomycin after discussing with patient and fiance operative findings and the nature, r/b/a to mitomycin instillation. They elected to proceed.

## 2016-05-24 ENCOUNTER — Telehealth: Payer: Self-pay

## 2016-05-24 ENCOUNTER — Ambulatory Visit: Payer: Self-pay

## 2016-05-24 VITALS — BP 150/90 | HR 83 | Ht 76.0 in | Wt 189.9 lb

## 2016-05-24 DIAGNOSIS — N329 Bladder disorder, unspecified: Secondary | ICD-10-CM

## 2016-05-24 LAB — SURGICAL PATHOLOGY

## 2016-05-24 NOTE — Telephone Encounter (Signed)
I spoke w/ Robinas from pathology department. She called with the pt bladder biopsy results. 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

## 2016-05-24 NOTE — Progress Notes (Unsigned)
Catheter Removal  Patient is present today for a catheter removal.  27ml of water was drained from the balloon. A 18FR foley cath was removed from the bladder no complications were noted . Patient tolerated well.  Preformed by: Toniann Fail, LPN   Follow up/ Additional notes: reinforced with pt to drink plenty of water and if not able to urinate to RTC. Pt voiced understanding.  Blood pressure (!) 150/90, pulse 83, height 6\' 4"  (1.93 m), weight 189 lb 14.4 oz (86.1 kg).

## 2016-06-02 ENCOUNTER — Ambulatory Visit: Payer: Self-pay

## 2016-06-04 ENCOUNTER — Encounter: Payer: Self-pay | Admitting: Urology

## 2016-06-04 ENCOUNTER — Ambulatory Visit (INDEPENDENT_AMBULATORY_CARE_PROVIDER_SITE_OTHER): Payer: Self-pay | Admitting: Urology

## 2016-06-04 VITALS — BP 166/91 | HR 72 | Ht 76.0 in | Wt 191.8 lb

## 2016-06-04 DIAGNOSIS — C678 Malignant neoplasm of overlapping sites of bladder: Secondary | ICD-10-CM

## 2016-06-04 NOTE — Progress Notes (Signed)
06/04/2016 3:15 PM   Carlos Holland Oct 13, 1965 [id number]  Referring provider: No referring provider defined for this encounter.  Chief Complaint  Patient presents with  . Follow-up    pathology report    HPI: The patient is a 51 year old gentleman who presents to discuss his pathology after undergoing a TURBT.  1. Low risk bladder cancer Pathology showed a 2.8 cm low-grade pTa TCC  He did receive postoperative mitomycin.  The tumor was found on ultrasound of the bladder due to renal ultrasound performed for obstructive uropathy. There is no stones at this time. He had a CT stone protocol in August 2017. At the time of his surgery in January 2018, bilateral retrograde polygrams are unremarkable.  2. Nephrolithiasis History of nephrolithiasis. No current evidence of disease or symptoms.  PMH: Past Medical History:  Diagnosis Date  . Hepatitis    hepatitis c  . History of kidney stones 01/2016    Surgical History: Past Surgical History:  Procedure Laterality Date  . APPENDECTOMY    . CYSTOSCOPY W/ RETROGRADES  05/21/2016   Procedure: CYSTOSCOPY WITH RETROGRADE PYELOGRAM;  Surgeon: Festus Aloe, MD;  Location: [facility] ORS;  Service: Urology;;  . KNEE SURGERY Left   . TRANSURETHRAL RESECTION OF BLADDER TUMOR N/A 05/21/2016   Procedure: TRANSURETHRAL RESECTION OF BLADDER TUMOR (TURBT);  Surgeon: Festus Aloe, MD;  Location: [facility] ORS;  Service: Urology;  Laterality: N/A;    Home Medications:  Allergies as of 06/04/2016      Reactions   Bee Venom Anaphylaxis   Penicillins Anaphylaxis   Has patient had a PCN reaction causing immediate rash, facial/tongue/throat swelling, SOB or lightheadedness with hypotension: Yes Has patient had a PCN reaction causing severe rash involving mucus membranes or skin necrosis: No Has patient had a PCN reaction that required hospitalization Yes Has patient had a PCN reaction occurring within the last 10 years: No If all of the above  answers are "NO", then may proceed with Cephalosporin use.   Tramadol Rash      Medication List       Accurate as of 06/04/16  3:15 PM. Always use your most recent med list.          BC HEADACHE POWDER PO Take 1 Package by mouth every 4 (four) hours as needed (pain or headache). 6 times daily as needed   nitrofurantoin 100 MG capsule Commonly known as:  MACRODANTIN Take 1 capsule (100 mg total) by mouth at bedtime.   oxyCODONE-acetaminophen 5-325 MG tablet Commonly known as:  ROXICET Take 1 tablet by mouth every 4 (four) hours as needed for severe pain.       Allergies:  Allergies  Allergen Reactions  . Bee Venom Anaphylaxis  . Penicillins Anaphylaxis    Has patient had a PCN reaction causing immediate rash, facial/tongue/throat swelling, SOB or lightheadedness with hypotension: Yes Has patient had a PCN reaction causing severe rash involving mucus membranes or skin necrosis: No Has patient had a PCN reaction that required hospitalization Yes Has patient had a PCN reaction occurring within the last 10 years: No If all of the above answers are "NO", then may proceed with Cephalosporin use.   . Tramadol Rash    Family History: Family History  Problem Relation Age of Onset  . Diabetes Mother   . Prostate cancer Father     Social History:  reports that he has been smoking Cigarettes.  He has been smoking about 1.50 packs per day. He has never used smokeless  tobacco. He reports that he does not drink alcohol or use drugs.  ROS:                                        Physical Exam: BP (!) 166/91 (BP Location: Left Arm, Patient Position: Sitting, Cuff Size: Normal)   Pulse 72   Ht 6\' 4"  (1.93 m)   Wt 191 lb 12.8 oz (87 kg)   BMI 23.35 kg/m   Constitutional:  Alert and oriented, No acute distress. HEENT: [place] AT, moist mucus membranes.  Trachea midline, no masses. Cardiovascular: No clubbing, cyanosis, or edema. Respiratory: Normal  respiratory effort, no increased work of breathing. GI: Abdomen is soft, nontender, nondistended, no abdominal masses GU: No CVA tenderness.  Skin: No rashes, bruises or suspicious lesions. Lymph: No cervical or inguinal adenopathy. Neurologic: Grossly intact, no focal deficits, moving all 4 extremities. Psychiatric: Normal mood and affect.  Laboratory Data: Lab Results  Component Value Date   WBC 10.7 (H) 05/11/2016   HGB 15.0 05/11/2016   HCT 44.5 05/11/2016   MCV 94.9 05/11/2016   PLT 169 05/11/2016    Lab Results  Component Value Date   CREATININE 0.93 05/11/2016    No results found for: PSA  No results found for: TESTOSTERONE  No results found for: HGBA1C  Urinalysis    Component Value Date/Time   COLORURINE YELLOW (A) 04/27/2016 2220   APPEARANCEUR Clear 04/29/2016 1441   LABSPEC 1.018 04/27/2016 2220   LABSPEC 1.020 08/30/2014 1509   PHURINE 5.0 04/27/2016 2220   GLUCOSEU Negative 04/29/2016 1441   GLUCOSEU Negative 08/30/2014 [place] 04/27/2016 2220   BILIRUBINUR Negative 04/29/2016 1441   BILIRUBINUR Negative 08/30/2014 [place] 04/27/2016 2220   PROTEINUR Negative 04/29/2016 1441   PROTEINUR NEGATIVE 04/27/2016 2220   NITRITE Negative 04/29/2016 1441   NITRITE NEGATIVE 04/27/2016 2220   LEUKOCYTESUR Negative 04/29/2016 1441   LEUKOCYTESUR Negative 08/30/2014 1509    Assessment & Plan:    1. Low risk bladder cancer I discussed with the patient his pathology results. He understands that based on size, grade, and stage that he is low risk. The recommendation at this time is for him to undergo surveillance cystoscopy in 3 months. We discussed this as well as the natural history of bladder cancer and recurrence. He will follow-up in 3 months for repeat cystoscopy.  2., History of nephrolithiasis No evidence of disease.   Return in about 3 months (around 09/02/2016) for cystoscopy.  Nickie Retort, MD  [facility]  [facility] [address], [place], [place] [address] [phone]

## 2016-07-07 ENCOUNTER — Emergency Department
Admission: EM | Admit: 2016-07-07 | Discharge: 2016-07-07 | Disposition: A | Discharge status: Home / Self Care | Payer: Self-pay | Attending: Emergency Medicine | Admitting: Emergency Medicine

## 2016-07-07 DIAGNOSIS — J069 Acute upper respiratory infection, unspecified: Secondary | ICD-10-CM | POA: Insufficient documentation

## 2016-07-07 DIAGNOSIS — B9789 Other viral agents as the cause of diseases classified elsewhere: Secondary | ICD-10-CM

## 2016-07-07 DIAGNOSIS — F17210 Nicotine dependence, cigarettes, uncomplicated: Secondary | ICD-10-CM | POA: Insufficient documentation

## 2016-07-07 MED ORDER — PROMETHAZINE-CODEINE 6.25-10 MG/5ML PO SYRP: 5.0000 mL | ORAL_SOLUTION | ORAL | 0 refills | Status: DC | PRN

## 2016-07-07 NOTE — ED Provider Notes (Signed)
[facility] Emergency Department Provider Note  ____________________________________________  Time seen: Approximately 8:42 PM  I have reviewed the triage vital signs and the nursing notes.   HISTORY  Chief Complaint Cough   HPI Carlos Holland is a 51 y.o. male who presents to the emergency department for evaluation of sinus pressure, cough, congestion, one episode of vomiting, and headache since yesterday. He denies fever.    Past Medical History:  Diagnosis Date  . Hepatitis    hepatitis c  . History of kidney stones 01/2016    There are no active problems to display for this patient.   Past Surgical History:  Procedure Laterality Date  . APPENDECTOMY    . CYSTOSCOPY W/ RETROGRADES  05/21/2016   Procedure: CYSTOSCOPY WITH RETROGRADE PYELOGRAM;  Surgeon: Festus Aloe, MD;  Location: [facility] ORS;  Service: Urology;;  . KNEE SURGERY Left   . TRANSURETHRAL RESECTION OF BLADDER TUMOR N/A 05/21/2016   Procedure: TRANSURETHRAL RESECTION OF BLADDER TUMOR (TURBT);  Surgeon: Festus Aloe, MD;  Location: [facility] ORS;  Service: Urology;  Laterality: N/A;    Prior to Admission medications   Medication Sig Start Date End Date Taking? Authorizing Provider  Aspirin-Salicylamide-Caffeine (BC HEADACHE POWDER PO) Take 1 Package by mouth every 4 (four) hours as needed (pain or headache). 6 times daily as needed    Historical Provider, MD  nitrofurantoin (MACRODANTIN) 100 MG capsule Take 1 capsule (100 mg total) by mouth at bedtime. Patient not taking: Reported on 06/04/2016 05/21/16   Festus Aloe, MD  oxyCODONE-acetaminophen (ROXICET) 5-325 MG tablet Take 1 tablet by mouth every 4 (four) hours as needed for severe pain. Patient not taking: Reported on 06/04/2016 05/21/16   Festus Aloe, MD  promethazine-codeine [facility] WITH CODEINE) 6.25-10 MG/5ML syrup Take 5 mLs by mouth every 4 (four) hours as needed for cough. 07/07/16   Victorino Dike, FNP     Allergies Bee venom; Penicillins; and Tramadol  Family History  Problem Relation Age of Onset  . Diabetes Mother   . Prostate cancer Father     Social History Social History  Substance Use Topics  . Smoking status: Current Every Day Smoker    Packs/day: 1.50    Types: Cigarettes  . Smokeless tobacco: Never Used  . Alcohol use No    Review of Systems Constitutional: Positive for fever/chills ENT: Negative for sore throat. Positive for sinus pressure. Cardiovascular: Denies chest pain. Respiratory: Negative for shortness of breath. Positive for cough. Gastrointestinal: Positive for nausea,  positive for one episode of vomiting.  No diarrhea.  Musculoskeletal: Negative for body aches Skin: Negative for rash. Neurological: Positive for headaches ____________________________________________   PHYSICAL EXAM:  VITAL SIGNS: ED Triage Vitals [07/07/16 2029]  Enc Vitals Group     BP 128/89     Pulse Rate 67     Resp 18     Temp 97.6 F (36.4 C)     Temp Source Oral     SpO2 100 %     Weight 193 lb (87.5 kg)     Height 6\' 4"  (1.93 m)     Head Circumference      Peak Flow      Pain Score 8     Pain Loc      Pain Edu?      Excl. in [place]?     Constitutional: Alert and oriented. Well appearing and in no acute distress. Eyes: Conjunctivae are normal. EOMI. Ears: Bilateral tympanic membranes are normal Nose: Frontal  sinus tenderness; no rhinnorhea. Mouth/Throat: Mucous membranes are moist.  Oropharynx mildly erythematous. Tonsils not visualized. Neck: No stridor.  Lymphatic: No cervical lymphadenopathy. Cardiovascular: Normal rate, regular rhythm. Grossly normal heart sounds.  Good peripheral circulation. Respiratory: Normal respiratory effort.  No retractions. Breath sounds clear to auscultation throughout. Gastrointestinal: Soft and nontender.  Musculoskeletal: FROM x 4 extremities.  Neurologic:  Normal speech and language.  Skin:  Skin is warm, dry and intact.  No rash noted. Psychiatric: Mood and affect are normal. Speech and behavior are normal.  ____________________________________________   LABS (all labs ordered are listed, but only abnormal results are displayed)  Labs Reviewed - No data to display ____________________________________________  EKG   ____________________________________________  RADIOLOGY  Not indicated ____________________________________________   PROCEDURES  Procedure(s) performed: None  Critical Care performed: No  ____________________________________________   INITIAL IMPRESSION / ASSESSMENT AND PLAN / ED COURSE     Pertinent labs & imaging results that were available during my care of the patient were reviewed by me and considered in my medical decision making (see chart for details).   51 year old male presenting to the emergency department for viral symptoms. He'll be given a prescription for Phenergan with codeine which should relieve his nausea, cough, and sinus pain. He has no sign or indication of sinus infection and therefore will be prescribed antibiotics tonight. He was encouraged to take Tylenol in addition to the Phenergan with codeine. He was encouraged to schedule follow-up appointment with primary care provider for choice for symptoms that are not improving over the next few days. He was encouraged to return to the emergency department for symptoms that change or worsen if he is unable schedule an appointment. ____________________________________________   FINAL CLINICAL IMPRESSION(S) / ED DIAGNOSES  Final diagnoses:  Viral URI with cough    Note:  This document was prepared using Dragon voice recognition software and may include unintentional dictation errors.     Victorino Dike, FNP 07/07/16 [place], MD 07/07/16 2350

## 2016-07-07 NOTE — ED Triage Notes (Signed)
Pt in with co dry cough, congestion, sinus pressure, and headache since yest. Also co sore throat, no fever.

## 2016-08-23 ENCOUNTER — Emergency Department
Admission: EM | Admit: 2016-08-23 | Discharge: 2016-08-23 | Disposition: A | Discharge status: Home / Self Care | Payer: Self-pay | Attending: Student in an Organized Health Care Education/Training Program | Admitting: Student in an Organized Health Care Education/Training Program

## 2016-08-23 DIAGNOSIS — F17210 Nicotine dependence, cigarettes, uncomplicated: Secondary | ICD-10-CM | POA: Insufficient documentation

## 2016-08-23 DIAGNOSIS — J069 Acute upper respiratory infection, unspecified: Secondary | ICD-10-CM | POA: Insufficient documentation

## 2016-08-23 MED ORDER — GUAIFENESIN-CODEINE 100-10 MG/5ML PO SOLN: 10.0000 mL | Freq: Three times a day (TID) | ORAL | 0 refills | Status: AC | PRN

## 2016-08-23 NOTE — ED Triage Notes (Signed)
Patient presents to the ED with body aches, chills, and congestion since Friday.  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  Ambulatory to triage.  Patient states, "they sent me home from work today."

## 2016-08-23 NOTE — ED Notes (Signed)
See triage note.states he developed cough and body aches and congestion on Friday  Afebrile on arrival   States he had the flu about 10 days ago

## 2016-08-23 NOTE — ED Provider Notes (Signed)
[facility] Emergency Department Provider Note  ____________________________________________  Time seen: Approximately 5:08 PM  I have reviewed the triage vital signs and the nursing notes.   HISTORY  Chief Complaint Nasal Congestion and Generalized Body Aches   HPI Carlos Holland is a 51 y.o. male who presents to the emergency department for evaluation of body aches, chills, and congestion since Friday. He states that about 10 days ago he was exposed to influenza. 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 He has not taken any medications for his symptoms.   Past Medical History:  Diagnosis Date  . Hepatitis    hepatitis c  . History of kidney stones 01/2016    There are no active problems to display for this patient.   Past Surgical History:  Procedure Laterality Date  . APPENDECTOMY    . CYSTOSCOPY W/ RETROGRADES  05/21/2016   Procedure: CYSTOSCOPY WITH RETROGRADE PYELOGRAM;  Surgeon: Festus Aloe, MD;  Location: [facility] ORS;  Service: Urology;;  . KNEE SURGERY Left   . TRANSURETHRAL RESECTION OF BLADDER TUMOR N/A 05/21/2016   Procedure: TRANSURETHRAL RESECTION OF BLADDER TUMOR (TURBT);  Surgeon: Festus Aloe, MD;  Location: [facility] ORS;  Service: Urology;  Laterality: N/A;    Prior to Admission medications   Medication Sig Start Date End Date Taking? Authorizing Provider  Aspirin-Salicylamide-Caffeine (BC HEADACHE POWDER PO) Take 1 Package by mouth every 4 (four) hours as needed (pain or headache). 6 times daily as needed    Historical Provider, MD  guaiFENesin-codeine 100-10 MG/5ML syrup Take 10 mLs by mouth 3 (three) times daily as needed. 08/23/16   Victorino Dike, FNP  nitrofurantoin (MACRODANTIN) 100 MG capsule Take 1 capsule (100 mg total) by mouth at bedtime. Patient not taking: Reported on 06/04/2016 05/21/16   Festus Aloe, MD   oxyCODONE-acetaminophen (ROXICET) 5-325 MG tablet Take 1 tablet by mouth every 4 (four) hours as needed for severe pain. Patient not taking: Reported on 06/04/2016 05/21/16   Festus Aloe, MD    Allergies Bee venom; Penicillins; and Tramadol  Family History  Problem Relation Age of Onset  . Diabetes Mother   . Prostate cancer Father     Social History Social History  Substance Use Topics  . Smoking status: Current Every Day Smoker    Packs/day: 1.00    Types: Cigarettes  . Smokeless tobacco: Never Used  . Alcohol use No    Review of Systems Constitutional: Negative for fever/chills ENT: Negative sore throat. Cardiovascular: Denies chest pain. Respiratory: Negative for shortness of breath. Positive for cough. Gastrointestinal: Negative for nausea,  no vomiting.  Negative for diarrhea.  Musculoskeletal: Positive for body aches Skin: Negative for rash. Neurological: Negative for headaches ____________________________________________   PHYSICAL EXAM:  VITAL SIGNS: ED Triage Vitals [08/23/16 1546]  Enc Vitals Group     BP (!) 147/85     Pulse Rate 80     Resp 20     Temp 98.4 F (36.9 C)     Temp Source Oral     SpO2 96 %     Weight 218 lb (98.9 kg)     Height 6\' 4"  (1.93 m)     Head Circumference      Peak Flow      Pain Score 8     Pain Loc      Pain Edu?  Excl. in [employer]?     Constitutional: Alert and oriented. Well appearing and in no acute distress. Eyes: Conjunctivae are normal. EOMI. Ears: Bilateral tympanic membranes appear normal Nose: Nasal congestion noted; no rhinnorhea. Mouth/Throat: Mucous membranes are moist.  Oropharynx normal. Tonsils not visualized. Neck: No stridor.  Lymphatic: No cervical lymphadenopathy. Cardiovascular: Normal rate, regular rhythm. Good peripheral circulation. Respiratory: Normal respiratory effort.  No retractions. Breath sounds Clear to auscultation throughout. Gastrointestinal: Soft and nontender.   Musculoskeletal: FROM x 4 extremities.  Neurologic:  Normal speech and language.  Skin:  Skin is warm, dry and intact. No rash noted. Psychiatric: Mood and affect are normal. Speech and behavior are normal.  ____________________________________________   LABS (all labs ordered are listed, but only abnormal results are displayed)  Labs Reviewed - No data to display ____________________________________________  EKG   ____________________________________________  RADIOLOGY  Not indicated ____________________________________________   PROCEDURES  Procedure(s) performed: None  Critical Care performed: No ____________________________________________   INITIAL IMPRESSION / ASSESSMENT AND PLAN / ED COURSE  51 year old male presenting to the emergency department for evaluation of cough, congestion, and body aches. Symptoms have been present for the past 3 days. Patient does smoke cigarettes, but there is no fever, tachycardia, adventitious breath sounds, or pain in the chest or back. He will be treated with a round of guaifenesin with codeine cough medication and advised to take ibuprofen for body aches. He was encouraged to follow up with the primary care provider of his choice for symptoms that are not improving over the next few days. He was instructed to return to the emergency department for symptoms that change or worsen if he is unable schedule an appointment.  Pertinent labs & imaging results that were available during my care of the patient were reviewed by me and considered in my medical decision making (see chart for details).  Discharge Medication List as of 08/23/2016  5:24 PM    START taking these medications   Details  guaiFENesin-codeine 100-10 MG/5ML syrup Take 10 mLs by mouth 3 (three) times daily as needed., Starting Mon 08/23/2016, Print        If controlled substance prescribed during this visit, 12 month history viewed on the [place] prior to issuing an  initial prescription for Schedule II or III opiod. ____________________________________________   FINAL CLINICAL IMPRESSION(S) / ED DIAGNOSES  Final diagnoses:  Viral upper respiratory tract infection    Note:  This document was prepared using Dragon voice recognition software and may include unintentional dictation errors.     Victorino Dike, FNP 08/24/16 0025    Merlyn Lot, MD 08/24/16 [phone]

## 2016-09-02 ENCOUNTER — Emergency Department: Payer: Self-pay

## 2016-09-02 ENCOUNTER — Telehealth: Payer: Self-pay | Admitting: Urology

## 2016-09-02 ENCOUNTER — Emergency Department
Admission: EM | Admit: 2016-09-02 | Discharge: 2016-09-03 | Disposition: A | Discharge status: Home / Self Care | Payer: Self-pay | Attending: Emergency Medicine | Admitting: Emergency Medicine

## 2016-09-02 ENCOUNTER — Encounter: Payer: Self-pay | Admitting: Emergency Medicine

## 2016-09-02 ENCOUNTER — Encounter: Payer: Self-pay | Admitting: Urology

## 2016-09-02 ENCOUNTER — Other Ambulatory Visit: Payer: Self-pay | Admitting: Urology

## 2016-09-02 DIAGNOSIS — F17210 Nicotine dependence, cigarettes, uncomplicated: Secondary | ICD-10-CM | POA: Insufficient documentation

## 2016-09-02 DIAGNOSIS — J069 Acute upper respiratory infection, unspecified: Secondary | ICD-10-CM

## 2016-09-02 MED ORDER — AZITHROMYCIN 500 MG PO TABS
500.0000 mg | ORAL_TABLET | Freq: Once | ORAL | Status: AC
  Administered 2016-09-02: 500 mg via ORAL
  Filled 2016-09-02: qty 1

## 2016-09-02 MED ORDER — AZITHROMYCIN 250 MG PO TABS: ORAL_TABLET | ORAL | 0 refills | Status: AC

## 2016-09-02 MED ORDER — PREDNISONE 20 MG PO TABS
60.0000 mg | ORAL_TABLET | Freq: Once | ORAL | Status: AC
  Administered 2016-09-02: 60 mg via ORAL
  Filled 2016-09-02: qty 3

## 2016-09-02 MED ORDER — PREDNISONE 10 MG (21) PO TBPK: ORAL_TABLET | ORAL | 0 refills | Status: AC

## 2016-09-02 MED ORDER — ALBUTEROL SULFATE HFA 108 (90 BASE) MCG/ACT IN AERS: 2.0000 | INHALATION_SPRAY | Freq: Four times a day (QID) | RESPIRATORY_TRACT | 0 refills | Status: AC | PRN

## 2016-09-02 MED ORDER — SODIUM CHLORIDE 0.9 % IV BOLUS (SEPSIS)
1000.0000 mL | Freq: Once | INTRAVENOUS | Status: AC
  Administered 2016-09-02: 1000 mL via INTRAVENOUS

## 2016-09-02 NOTE — ED Provider Notes (Signed)
[facility] Emergency Department Provider Note  ____________________________________________  Time seen: Approximately 11:15 PM  I have reviewed the triage vital signs and the nursing notes.   HISTORY  Chief Complaint Cough    HPI Carlos Holland is a 51 y.o. male presents the emergency department with fever, chills, weakness, nasal congestion, sore throat, and cough for 11 days. He states that cough is the worst symptom and has progressively getting worse. Cough is nonproductive. He is also having episodes of diarrhea. Patient states that he was seen in the emergency department 10 days ago and was told that he has a viral illness. He was given cough medicine, which has not been helping.He has not been able to eat or drink as much. Patient states that "I used to smoke but I quit when I got sick." He has smoked a pack and a half a day since he was young. He denies headache, shortness of breath, chest pain, nausea, vomiting, abdominal pain.   Past Medical History:  Diagnosis Date  . Hepatitis    hepatitis c  . History of kidney stones 01/2016    There are no active problems to display for this patient.   Past Surgical History:  Procedure Laterality Date  . APPENDECTOMY    . CYSTOSCOPY W/ RETROGRADES  05/21/2016   Procedure: CYSTOSCOPY WITH RETROGRADE PYELOGRAM;  Surgeon: Festus Aloe, MD;  Location: [facility] ORS;  Service: Urology;;  . KNEE SURGERY Left   . TRANSURETHRAL RESECTION OF BLADDER TUMOR N/A 05/21/2016   Procedure: TRANSURETHRAL RESECTION OF BLADDER TUMOR (TURBT);  Surgeon: Festus Aloe, MD;  Location: [facility] ORS;  Service: Urology;  Laterality: N/A;    Prior to Admission medications   Medication Sig Start Date End Date Taking? Authorizing Provider  albuterol (PROVENTIL HFA;VENTOLIN HFA) 108 (90 Base) MCG/ACT inhaler Inhale 2 puffs into the lungs every 6 (six) hours as needed for wheezing or shortness of breath. 09/02/16   Laban Emperor, PA-C   Aspirin-Salicylamide-Caffeine (BC HEADACHE POWDER PO) Take 1 Package by mouth every 4 (four) hours as needed (pain or headache). 6 times daily as needed    Historical Provider, MD  azithromycin (ZITHROMAX Z-PAK) 250 MG tablet Take 2 tablets (500 mg) on  Day 1,  followed by 1 tablet (250 mg) once daily on Days 2 through 5. 09/02/16   Laban Emperor, PA-C  guaiFENesin-codeine 100-10 MG/5ML syrup Take 10 mLs by mouth 3 (three) times daily as needed. 08/23/16   Victorino Dike, FNP  nitrofurantoin (MACRODANTIN) 100 MG capsule Take 1 capsule (100 mg total) by mouth at bedtime. Patient not taking: Reported on 06/04/2016 05/21/16   Festus Aloe, MD  oxyCODONE-acetaminophen (ROXICET) 5-325 MG tablet Take 1 tablet by mouth every 4 (four) hours as needed for severe pain. Patient not taking: Reported on 06/04/2016 05/21/16   Festus Aloe, MD  predniSONE (STERAPRED UNI-PAK 21 TAB) 10 MG (21) TBPK tablet Take 6 tablets on day 1, take 5 tablets on day 2, take 4 tablets on day 3, take 3 tablets on day 4, take 2 tablets on day 5, take 1 tablet on day 6 09/02/16   Laban Emperor, PA-C    Allergies Bee venom; Penicillins; and Tramadol  Family History  Problem Relation Age of Onset  . Diabetes Mother   . Prostate cancer Father     Social History Social History  Substance Use Topics  . Smoking status: Current Every Day Smoker    Packs/day: 1.00    Types: Cigarettes  .  Smokeless tobacco: Never Used  . Alcohol use No     Review of Systems  Constitutional: Positive for chills Eyes: No visual changes. No discharge. ENT: Positive for congestion and rhinorrhea. Cardiovascular: No chest pain. Respiratory: Positive for cough. No SOB. Gastrointestinal: No abdominal pain.  No nausea, no vomiting.  Positive for diarrhea.  No constipation. Musculoskeletal: Negative for musculoskeletal pain. Skin: Negative for rash, abrasions, lacerations, ecchymosis. Neurological: Negative for  headaches.   ____________________________________________   PHYSICAL EXAM:  VITAL SIGNS: ED Triage Vitals [09/02/16 2132]  Enc Vitals Group     BP 130/77     Pulse Rate 94     Resp 20     Temp 98.9 F (37.2 C)     Temp Source Oral     SpO2 96 %     Weight 218 lb (98.9 kg)     Height 6\' 4"  (1.93 m)     Head Circumference      Peak Flow      Pain Score 7     Pain Loc      Pain Edu?      Excl. in [place]?      Constitutional: Alert and oriented. Well appearing and in no acute distress. Eyes: Conjunctivae are normal. PERRL. EOMI. No discharge. Head: Atraumatic. ENT: No frontal and maxillary sinus tenderness.      Ears: Tympanic membranes occluded with cerumen.      Nose: Mild congestion/rhinnorhea.      Mouth/Throat: Mucous membranes are moist. Oropharynx non-erythematous. Tonsils not enlarged. No exudates. Uvula midline. Neck: No stridor.   Hematological/Lymphatic/Immunilogical: No cervical lymphadenopathy. Cardiovascular: Normal rate, regular rhythm.  Good peripheral circulation. Respiratory: Normal respiratory effort without tachypnea or retractions. Lungs CTAB. Good air entry to the bases with no decreased or absent breath sounds. Gastrointestinal: Bowel sounds 4 quadrants. Soft and nontender to palpation. No guarding or rigidity. No palpable masses. No distention. Musculoskeletal: Full range of motion to all extremities. No gross deformities appreciated. Neurologic:  Normal speech and language. No gross focal neurologic deficits are appreciated.  Skin:  Skin is warm, dry and intact. No rash noted.   ____________________________________________   LABS (all labs ordered are listed, but only abnormal results are displayed)  Labs Reviewed - No data to display ____________________________________________  EKG   ____________________________________________  RADIOLOGY Robinette Haines, personally viewed and evaluated these images (plain radiographs) as part of my  medical decision making, as well as reviewing the written report by the radiologist.  Dg Chest 2 View  Result Date: 09/02/2016 CLINICAL DATA:  Chills and congestion EXAM: CHEST  2 VIEW COMPARISON:  Chest radiograph 09/02/2016 FINDINGS: Repeat chest radiographs with nipple markers in place confirmed that the previously described opacities are nipple shadows. Otherwise normal appearance of the chest. IMPRESSION: Previously described nodular opacity confirmed to be nipple shadow. Electronically Signed   By: Ulyses Jarred M.D.   On: 09/02/2016 22:37   Dg Chest 2 View  Result Date: 09/02/2016 CLINICAL DATA:  Cough. EXAM: CHEST  2 VIEW COMPARISON:  Radiographs of May 25, 2014. FINDINGS: The heart size and mediastinal contours are within normal limits. No pneumothorax or pleural effusion is noted. Left lung is clear. Nodular density seen projected over the right lower lobe which may represent nipple shadow. The visualized skeletal structures are unremarkable. IMPRESSION: Nodular density seen projected over right lung base which may represent nipple shadow, but repeat radiograph with nipple markers is recommended to rule out pulmonary nodule. No other abnormality seen in  the chest. Electronically Signed   By: Marijo Conception, M.D.   On: 09/02/2016 21:54    ____________________________________________    PROCEDURES  Procedure(s) performed:    Procedures    Medications  sodium chloride 0.9 % bolus 1,000 mL (1,000 mLs Intravenous New Bag/Given 09/02/16 2307)  predniSONE (DELTASONE) tablet 60 mg (60 mg Oral Given 09/02/16 2318)  azithromycin (ZITHROMAX) tablet 500 mg (500 mg Oral Given 09/02/16 2319)     ____________________________________________   INITIAL IMPRESSION / ASSESSMENT AND PLAN / ED COURSE  Pertinent labs & imaging results that were available during my care of the patient were reviewed by me and considered in my medical decision making (see chart for details).  Review of the [place] was performed in accordance of the [place] prior to dispensing any controlled drugs.     Patient's diagnosis is consistent with upper respiratory infection. Vital signs and exam are reassuring. He is afebrile in ED and has not had any medication. Chest x-ray negative for acute cardiopulmonary processes. Repeat chest x-ray was completed to rule out pulmonary nodule. Patient received a liter of fluids in ED. He was given a dose of azithromycin and prednisone in ED. Patient feels comfortable going home. He is very agitated and frustrated that he is not getting better. Patient will be discharged home with prescriptions for azithromycin, prednisone, albuterol inhaler. Patient is to follow up with PCP as needed or otherwise directed. Patient is given ED precautions to return to the ED for any worsening or new symptoms.   ____________________________________________  FINAL CLINICAL IMPRESSION(S) / ED DIAGNOSES  Final diagnoses:  Upper respiratory tract infection, unspecified type      NEW MEDICATIONS STARTED DURING THIS VISIT:  New Prescriptions   ALBUTEROL (PROVENTIL HFA;VENTOLIN HFA) 108 (90 BASE) MCG/ACT INHALER    Inhale 2 puffs into the lungs every 6 (six) hours as needed for wheezing or shortness of breath.   AZITHROMYCIN (ZITHROMAX Z-PAK) 250 MG TABLET    Take 2 tablets (500 mg) on  Day 1,  followed by 1 tablet (250 mg) once daily on Days 2 through 5.   PREDNISONE (STERAPRED UNI-PAK 21 TAB) 10 MG (21) TBPK TABLET    Take 6 tablets on day 1, take 5 tablets on day 2, take 4 tablets on day 3, take 3 tablets on day 4, take 2 tablets on day 5, take 1 tablet on day 6        This chart was dictated using voice recognition software/Dragon. Despite best efforts to proofread, errors can occur which can change the meaning. Any change was purely unintentional.    Laban Emperor, PA-C 09/03/16 0003    Orbie Pyo, MD 09/03/16 [phone]

## 2016-09-02 NOTE — ED Notes (Signed)
See triage note, pt reports over a week he's had continuous coughing, sore throat and headache. Pt denies fever or cough production.

## 2016-09-02 NOTE — Telephone Encounter (Signed)
Pt was a no show for cysto appt  today, just F.Y.I.

## 2016-09-02 NOTE — ED Triage Notes (Signed)
Patient with complaint of cough times 11 days ago. Patient seen here for the same 10 days ago and diagnosed with URI. Patient was given cough medication. Patient reports that he has had no improvement with his cough.

## 2016-09-06 NOTE — Telephone Encounter (Signed)
Thank you for this message. Please make sure that this patient receives a certified letter indicating that without the proper surveillance, untreated bladder cancer can lead to metastatic disease which is no longer curable.  Routine follow-up is extremely important.  Hollice Espy, MD

## 2016-09-06 NOTE — Telephone Encounter (Signed)
Will send a certified letter.

## 2016-09-13 ENCOUNTER — Emergency Department
Admission: EM | Admit: 2016-09-13 | Discharge: 2016-09-13 | Disposition: A | Discharge status: Home / Self Care | Payer: Self-pay | Attending: Emergency Medicine | Admitting: Emergency Medicine

## 2016-09-13 ENCOUNTER — Encounter: Payer: Self-pay | Admitting: Emergency Medicine

## 2016-09-13 DIAGNOSIS — F17210 Nicotine dependence, cigarettes, uncomplicated: Secondary | ICD-10-CM | POA: Insufficient documentation

## 2016-09-13 DIAGNOSIS — K5900 Constipation, unspecified: Secondary | ICD-10-CM | POA: Insufficient documentation

## 2016-09-13 MED ORDER — MAGNESIUM CITRATE PO SOLN
0.5000 | Freq: Once | ORAL | Status: AC
  Administered 2016-09-13: 0.5 via ORAL
  Filled 2016-09-13: qty 296

## 2016-09-13 MED ORDER — POLYETHYLENE GLYCOL 3350 17 G PO PACK: 17.0000 g | PACK | Freq: Every day | ORAL | 0 refills | Status: AC | PRN

## 2016-09-13 MED ORDER — LACTULOSE 10 GM/15ML PO SOLN
10.0000 g | Freq: Once | ORAL | Status: AC
  Administered 2016-09-13: 10 g via ORAL
  Filled 2016-09-13: qty 30

## 2016-09-13 MED ORDER — DOCUSATE SODIUM 100 MG PO CAPS
100.0000 mg | ORAL_CAPSULE | Freq: Once | ORAL | Status: AC
  Administered 2016-09-13: 100 mg via ORAL
  Filled 2016-09-13: qty 1

## 2016-09-13 NOTE — ED Provider Notes (Signed)
[facility] Emergency Department Provider Note  ____________________________________________   First MD Initiated Contact with Patient 09/13/16 [phone]     (approximate)  I have reviewed the triage vital signs and the nursing notes.   HISTORY  Chief Complaint Constipation   HPI Carlos Holland is a 51 y.o. male with a history of kidney stones as well as hepatitis C who is presenting to the emergency department today with constipation over the past 8 days. He says that he usually has a bowel movement once or twice per day. However, he is only been having gas over the past 8 days. He says that he has had intermittent abdominal cramping as well as pain and discomfort to his rectum. He says that he is still passing gas and has tried a fiber laxative without any relief. Says that he is usually able to eat yogurt and coffee which helped him move his bowels but has not been working this week. He denies any changes in diet or use of opiate pain medications.   Past Medical History:  Diagnosis Date  . Hepatitis    hepatitis c  . History of kidney stones 01/2016    There are no active problems to display for this patient.   Past Surgical History:  Procedure Laterality Date  . APPENDECTOMY    . CYSTOSCOPY W/ RETROGRADES  05/21/2016   Procedure: CYSTOSCOPY WITH RETROGRADE PYELOGRAM;  Surgeon: Festus Aloe, MD;  Location: [facility] ORS;  Service: Urology;;  . KNEE SURGERY Left   . TRANSURETHRAL RESECTION OF BLADDER TUMOR N/A 05/21/2016   Procedure: TRANSURETHRAL RESECTION OF BLADDER TUMOR (TURBT);  Surgeon: Festus Aloe, MD;  Location: [facility] ORS;  Service: Urology;  Laterality: N/A;    Prior to Admission medications   Medication Sig Start Date End Date Taking? Authorizing Provider  albuterol (PROVENTIL HFA;VENTOLIN HFA) 108 (90 Base) MCG/ACT inhaler Inhale 2 puffs into the lungs every 6 (six) hours as needed for wheezing or shortness of breath. Patient not taking:  Reported on 09/13/2016 09/02/16   Laban Emperor, PA-C  azithromycin (ZITHROMAX Z-PAK) 250 MG tablet Take 2 tablets (500 mg) on  Day 1,  followed by 1 tablet (250 mg) once daily on Days 2 through 5. Patient not taking: Reported on 09/13/2016 09/02/16   Laban Emperor, PA-C  guaiFENesin-codeine 100-10 MG/5ML syrup Take 10 mLs by mouth 3 (three) times daily as needed. Patient not taking: Reported on 09/13/2016 08/23/16   Victorino Dike, FNP  nitrofurantoin (MACRODANTIN) 100 MG capsule Take 1 capsule (100 mg total) by mouth at bedtime. Patient not taking: Reported on 06/04/2016 05/21/16   Festus Aloe, MD  oxyCODONE-acetaminophen (ROXICET) 5-325 MG tablet Take 1 tablet by mouth every 4 (four) hours as needed for severe pain. Patient not taking: Reported on 06/04/2016 05/21/16   Festus Aloe, MD  predniSONE (STERAPRED UNI-PAK 21 TAB) 10 MG (21) TBPK tablet Take 6 tablets on day 1, take 5 tablets on day 2, take 4 tablets on day 3, take 3 tablets on day 4, take 2 tablets on day 5, take 1 tablet on day 6 Patient not taking: Reported on 09/13/2016 09/02/16   Laban Emperor, PA-C    Allergies Bee venom; Penicillins; and Tramadol  Family History  Problem Relation Age of Onset  . Diabetes Mother   . Prostate cancer Father     Social History Social History  Substance Use Topics  . Smoking status: Current Every Day Smoker    Packs/day: 1.00    Types:  Cigarettes  . Smokeless tobacco: Never Used  . Alcohol use No    Review of Systems  Constitutional: No fever/chills Eyes: No visual changes. ENT: No sore throat. Cardiovascular: Denies chest pain. Respiratory: Denies shortness of breath. Gastrointestinal:  No nausea, no vomiting.  No diarrhea.  Genitourinary: Negative for dysuria. Musculoskeletal: Negative for back pain. Skin: Negative for rash. Neurological: Negative for headaches, focal weakness or numbness.   ____________________________________________   PHYSICAL EXAM:  VITAL SIGNS: ED  Triage Vitals  Enc Vitals Group     BP 09/13/16 0724 (!) 142/81     Pulse Rate 09/13/16 0724 65     Resp 09/13/16 0724 16     Temp 09/13/16 0724 97.7 F (36.5 C)     Temp Source 09/13/16 0724 Oral     SpO2 09/13/16 0724 97 %     Weight 09/13/16 0721 218 lb (98.9 kg)     Height 09/13/16 0721 6\' 4"  (1.93 m)     Head Circumference --      Peak Flow --      Pain Score 09/13/16 0721 8     Pain Loc --      Pain Edu? --      Excl. in [place]? --     Constitutional: Alert and oriented. Well appearing and in no acute distress. Eyes: Conjunctivae are normal. PERRL. EOMI. Head: Atraumatic. Nose: No congestion/rhinnorhea. Mouth/Throat: Mucous membranes are moist.   Neck: No stridor.   Cardiovascular: Normal rate, regular rhythm. Grossly normal heart sounds.   Respiratory: Normal respiratory effort.  No retractions. Lungs CTAB. Gastrointestinal: Soft and nontender. No distention.Digital Rectal exam without fecal impaction. Only a small amount of brown stool in the vault. Musculoskeletal: No lower extremity tenderness nor edema.  No joint effusions. Neurologic:  Normal speech and language. No gross focal neurologic deficits are appreciated.  Skin:  Skin is warm, dry and intact. No rash noted. Psychiatric: Mood and affect are normal. Speech and behavior are normal.  ____________________________________________   LABS (all labs ordered are listed, but only abnormal results are displayed)  Labs Reviewed - No data to display ____________________________________________  EKG   ____________________________________________  RADIOLOGY   ____________________________________________   PROCEDURES  Procedure(s) performed:   Procedures  Critical Care performed:   ____________________________________________   INITIAL IMPRESSION / ASSESSMENT AND PLAN / ED COURSE  Pertinent labs & imaging results that were available during my care of the patient were reviewed by me and considered in my  medical decision making (see chart for details).  No stool to disimpact. We will proceed with an enema.    ----------------------------------------- 10:38 AM on 09/13/2016 -----------------------------------------  Patient had an enema but with only a small bowel movement. Says he has had cramping in his abdomen since having the enema. However, he is nontender. Patient says that he has taken several days of a fiber supplement. We'll be slightly more aggressive and started on MiraLAX.Return for any abdominal distention, vomiting or worsening pain.  ____________________________________________   FINAL CLINICAL IMPRESSION(S) / ED DIAGNOSES  Constipation.    NEW MEDICATIONS STARTED DURING THIS VISIT:  New Prescriptions   No medications on file     Note:  This document was prepared using Dragon voice recognition software and may include unintentional dictation errors.    Orbie Pyo, MD 09/13/16 [phone]

## 2016-09-13 NOTE — ED Triage Notes (Signed)
Pt reports constipation x8 days, reports taking OTC laxatives without relief.

## 2016-09-13 NOTE — ED Notes (Signed)
Soap suds enema administered without difficulty.

## 2016-09-16 ENCOUNTER — Telehealth: Payer: Self-pay

## 2016-09-16 NOTE — Telephone Encounter (Signed)
Certified letter sent. Tracking #- [id number] [phone]

## 2016-09-27 ENCOUNTER — Encounter: Payer: Self-pay | Admitting: Emergency Medicine

## 2016-09-27 ENCOUNTER — Emergency Department
Admission: EM | Admit: 2016-09-27 | Discharge: 2016-09-27 | Disposition: A | Discharge status: Home / Self Care | Payer: Self-pay | Attending: Emergency Medicine | Admitting: Emergency Medicine

## 2016-09-27 DIAGNOSIS — Z8551 Personal history of malignant neoplasm of bladder: Secondary | ICD-10-CM | POA: Insufficient documentation

## 2016-09-27 DIAGNOSIS — Z79899 Other long term (current) drug therapy: Secondary | ICD-10-CM | POA: Insufficient documentation

## 2016-09-27 DIAGNOSIS — J029 Acute pharyngitis, unspecified: Secondary | ICD-10-CM | POA: Insufficient documentation

## 2016-09-27 DIAGNOSIS — R0981 Nasal congestion: Secondary | ICD-10-CM | POA: Insufficient documentation

## 2016-09-27 DIAGNOSIS — J069 Acute upper respiratory infection, unspecified: Secondary | ICD-10-CM | POA: Insufficient documentation

## 2016-09-27 DIAGNOSIS — F17210 Nicotine dependence, cigarettes, uncomplicated: Secondary | ICD-10-CM | POA: Insufficient documentation

## 2016-09-27 LAB — POCT RAPID STREP A: STREPTOCOCCUS, GROUP A SCREEN (DIRECT): NEGATIVE

## 2016-09-27 NOTE — ED Provider Notes (Signed)
[facility] Emergency Department Provider Note   ____________________________________________    I have reviewed the triage vital signs and the nursing notes.   HISTORY  Chief Complaint Cough; Sore Throat; and Nasal Congestion     HPI Carlos Holland is a 51 y.o. male who presents with complaints of nasal congestion, mild sore throat and intermittent cough. Symptoms started 2 days ago. Reports subjective fevers as well. He has not taken anything for this. He denies recent travel. Was exposed to someone with similar symptoms last week. No shortness of breath   Past Medical History:  Diagnosis Date  . Hepatitis    hepatitis c  . History of kidney stones 01/2016    There are no active problems to display for this patient.   Past Surgical History:  Procedure Laterality Date  . APPENDECTOMY    . CYSTOSCOPY W/ RETROGRADES  05/21/2016   Procedure: CYSTOSCOPY WITH RETROGRADE PYELOGRAM;  Surgeon: Festus Aloe, MD;  Location: [facility] ORS;  Service: Urology;;  . KNEE SURGERY Left   . TRANSURETHRAL RESECTION OF BLADDER TUMOR N/A 05/21/2016   Procedure: TRANSURETHRAL RESECTION OF BLADDER TUMOR (TURBT);  Surgeon: Festus Aloe, MD;  Location: [facility] ORS;  Service: Urology;  Laterality: N/A;    Prior to Admission medications   Medication Sig Start Date End Date Taking? Authorizing Provider  albuterol (PROVENTIL HFA;VENTOLIN HFA) 108 (90 Base) MCG/ACT inhaler Inhale 2 puffs into the lungs every 6 (six) hours as needed for wheezing or shortness of breath. Patient not taking: Reported on 09/13/2016 09/02/16   Laban Emperor, PA-C  azithromycin (ZITHROMAX Z-PAK) 250 MG tablet Take 2 tablets (500 mg) on  Day 1,  followed by 1 tablet (250 mg) once daily on Days 2 through 5. Patient not taking: Reported on 09/13/2016 09/02/16   Laban Emperor, PA-C  guaiFENesin-codeine 100-10 MG/5ML syrup Take 10 mLs by mouth 3 (three) times daily as needed. Patient not taking:  Reported on 09/13/2016 08/23/16   Sherrie George B, FNP  nitrofurantoin (MACRODANTIN) 100 MG capsule Take 1 capsule (100 mg total) by mouth at bedtime. Patient not taking: Reported on 06/04/2016 05/21/16   Festus Aloe, MD  oxyCODONE-acetaminophen (ROXICET) 5-325 MG tablet Take 1 tablet by mouth every 4 (four) hours as needed for severe pain. Patient not taking: Reported on 06/04/2016 05/21/16   Festus Aloe, MD  polyethylene glycol [facility]) packet Take 17 g by mouth daily as needed (constipation). 09/13/16   [name], Randall An, MD  predniSONE (STERAPRED UNI-PAK 21 TAB) 10 MG (21) TBPK tablet Take 6 tablets on day 1, take 5 tablets on day 2, take 4 tablets on day 3, take 3 tablets on day 4, take 2 tablets on day 5, take 1 tablet on day 6 Patient not taking: Reported on 09/13/2016 09/02/16   Laban Emperor, PA-C     Allergies Bee venom; Penicillins; and Tramadol  Family History  Problem Relation Age of Onset  . Diabetes Mother   . Prostate cancer Father     Social History Social History  Substance Use Topics  . Smoking status: Current Every Day Smoker    Packs/day: 1.00    Types: Cigarettes  . Smokeless tobacco: Never Used  . Alcohol use No    Review of Systems  Constitutional: Subjective fevers and chills  ENT: As above   Gastrointestinal: Nausea, now resolved Genitourinary: Negative for dysuria. Musculoskeletal: No myalgias Skin: Negative for rash. Neurological: Negative for headaches     ____________________________________________   PHYSICAL EXAM:  VITAL SIGNS: ED Triage Vitals [09/27/16 0624]  Enc Vitals Group     BP (!) 144/82     Pulse Rate 77     Resp 18     Temp 97.7 F (36.5 C)     Temp Source Oral     SpO2 98 %     Weight 218 lb (98.9 kg)     Height 6\' 4"  (1.93 m)     Head Circumference      Peak Flow      Pain Score 6     Pain Loc      Pain Edu?      Excl. in [place]?     Constitutional: Alert and oriented. No acute distress. Pleasant  and interactive Eyes: Conjunctivae are normal.  Head: Atraumatic. Nose: No congestion/rhinnorhea. Mouth/Throat: Mucous membranes are moist.  Pharynx mildly erythematous Cardiovascular: Normal rate, regular rhythm.  Respiratory: Normal respiratory effort.  No retractions.Clear to auscultation bilaterally Genitourinary: deferred Musculoskeletal: No lower extremity tenderness nor edema.   Neurologic:  Normal speech and language. No gross focal neurologic deficits are appreciated.   Skin:  Skin is warm, dry and intact. No rash noted.   ____________________________________________   LABS (all labs ordered are listed, but only abnormal results are displayed)  Labs Reviewed  POCT RAPID STREP A   ____________________________________________  EKG   ____________________________________________  RADIOLOGY  None ____________________________________________   PROCEDURES  Procedure(s) performed: No    Critical Care performed: No ____________________________________________   INITIAL IMPRESSION / ASSESSMENT AND PLAN / ED COURSE  Pertinent labs & imaging results that were available during my care of the patient were reviewed by me and considered in my medical decision making (see chart for details).  Patient well appearing and No acute distress. History of present illness and exam most consistent with viral upper respiratory infection. Vital signs unremarkable. No shortness of breath or significant cough. Recommend supportive care.   ____________________________________________   FINAL CLINICAL IMPRESSION(S) / ED DIAGNOSES  Final diagnoses:  Viral upper respiratory tract infection      NEW MEDICATIONS STARTED DURING THIS VISIT:  New Prescriptions   No medications on file     Note:  This document was prepared using Dragon voice recognition software and may include unintentional dictation errors.    Lavonia Drafts, MD 09/27/16 [phone]

## 2016-09-27 NOTE — ED Triage Notes (Signed)
Patient with complaint of cough, congestion and sore throat that started on Saturday. Patient reports that he felt like he had a fever last night but did not have a thermometer.

## 2016-10-14 ENCOUNTER — Emergency Department: Payer: Self-pay

## 2016-10-14 DIAGNOSIS — R091 Pleurisy: Secondary | ICD-10-CM | POA: Insufficient documentation

## 2016-10-14 DIAGNOSIS — F17210 Nicotine dependence, cigarettes, uncomplicated: Secondary | ICD-10-CM | POA: Insufficient documentation

## 2016-10-14 DIAGNOSIS — Z79899 Other long term (current) drug therapy: Secondary | ICD-10-CM | POA: Insufficient documentation

## 2016-10-14 DIAGNOSIS — Z791 Long term (current) use of non-steroidal anti-inflammatories (NSAID): Secondary | ICD-10-CM | POA: Insufficient documentation

## 2016-10-14 LAB — BASIC METABOLIC PANEL
Anion gap: 8 (ref 5–15)
BUN: 25 mg/dL — AB (ref 6–20)
CALCIUM: 9.2 mg/dL (ref 8.9–10.3)
CHLORIDE: 107 mmol/L (ref 101–111)
CO2: 25 mmol/L (ref 22–32)
CREATININE: 1.25 mg/dL — AB (ref 0.61–1.24)
GFR calc Af Amer: >60 mL/min (ref >60)
GFR calc non Af Amer: >60 mL/min (ref >60)
GLUCOSE: 111 mg/dL — AB (ref 65–99)
Potassium: 3.8 mmol/L (ref 3.5–5.1)
Sodium: 140 mmol/L (ref 135–145)

## 2016-10-14 LAB — CBC
HCT: 41.5 % (ref 40.0–52.0)
Hemoglobin: 14 g/dL (ref 13.0–18.0)
MCH: 30.9 pg (ref 26.0–34.0)
MCHC: 33.6 g/dL (ref 32.0–36.0)
MCV: 91.8 fL (ref 80.0–100.0)
PLATELETS: 161 10*3/uL (ref 150–440)
RBC: 4.52 MIL/uL (ref 4.40–5.90)
RDW: 13.9 % (ref 11.5–14.5)
WBC: 10.2 10*3/uL (ref 3.8–10.6)

## 2016-10-14 LAB — TROPONIN I

## 2016-10-14 NOTE — ED Triage Notes (Addendum)
Pt comes via [facility] from [facility] for c/o left sided chest pain that radiates to his left arm. Pt states numbness and tingling to left arm. Pt states he has also been sweaty, dizzy and experienced nausea. Per EMS pt was given 324 aspirin.  Respirations even and unlabored at this time.

## 2016-10-15 ENCOUNTER — Emergency Department
Admission: EM | Admit: 2016-10-15 | Discharge: 2016-10-15 | Disposition: A | Discharge status: Home / Self Care | Payer: Self-pay | Attending: Emergency Medicine | Admitting: Emergency Medicine

## 2016-10-15 DIAGNOSIS — R079 Chest pain, unspecified: Secondary | ICD-10-CM

## 2016-10-15 DIAGNOSIS — R091 Pleurisy: Secondary | ICD-10-CM

## 2016-10-15 LAB — URINE DRUG SCREEN, QUALITATIVE (ARMC ONLY)
Amphetamines, Ur Screen: NOT DETECTED
BARBITURATES, UR SCREEN: NOT DETECTED
BENZODIAZEPINE, UR SCRN: NOT DETECTED
CANNABINOID 50 NG, UR ~~LOC~~: NOT DETECTED
COCAINE METABOLITE, UR ~~LOC~~: NOT DETECTED
MDMA (Ecstasy)Ur Screen: NOT DETECTED
METHADONE SCREEN, URINE: NOT DETECTED
Opiate, Ur Screen: NOT DETECTED
Phencyclidine (PCP) Ur S: NOT DETECTED
TRICYCLIC, UR SCREEN: NOT DETECTED

## 2016-10-15 LAB — TROPONIN I

## 2016-10-15 MED ORDER — OXYCODONE-ACETAMINOPHEN 5-325 MG PO TABS
1.0000 | ORAL_TABLET | Freq: Once | ORAL | Status: AC
  Administered 2016-10-15: 1 via ORAL
  Filled 2016-10-15: qty 1

## 2016-10-15 MED ORDER — IBUPROFEN 800 MG PO TABS: 800.0000 mg | ORAL_TABLET | Freq: Three times a day (TID) | ORAL | 0 refills | Status: AC | PRN

## 2016-10-15 MED ORDER — KETOROLAC TROMETHAMINE 30 MG/ML IJ SOLN
10.0000 mg | Freq: Once | INTRAMUSCULAR | Status: AC
  Administered 2016-10-15: 9.9 mg via INTRAVENOUS
  Filled 2016-10-15: qty 1

## 2016-10-15 MED ORDER — ASPIRIN 81 MG PO CHEW: 324.0000 mg | CHEWABLE_TABLET | Freq: Once | ORAL | Status: DC

## 2016-10-15 MED ORDER — OXYCODONE-ACETAMINOPHEN 5-325 MG PO TABS: 1.0000 | ORAL_TABLET | ORAL | 0 refills | Status: DC | PRN

## 2016-10-15 NOTE — ED Provider Notes (Signed)
[facility] Emergency Department Provider Note   ____________________________________________   First MD Initiated Contact with Patient 10/15/16 0119     (approximate)  I have reviewed the triage vital signs and the nursing notes.   HISTORY  Chief Complaint Chest Pain    HPI Carlos Holland is a 51 y.o. male brought to the ED via EMS from shelter with a chief complaint of chest pain. Patient reports central sharp chest pain radiating to his left arm. Onset approximately 10 PM while at rest. Reports numbness and tingling to left arm. Symptoms associated with diaphoresis, nausea and dizziness. Denies shortness of breath.Patient given aspirin en route to the ED. Denies similar symptoms previously.no prior history of cardiac disease. He was seen in the ED 2 weeks ago for cold-like symptoms. Denies current fever, chills, cough, abdominal pain, vomiting, diarrhea. Denies recent travel or trauma. Denies hormone use. Nothing makes his symptoms better. Movement makes his symptoms worse.   Past Medical History:  Diagnosis Date  . Hepatitis    hepatitis c  . History of kidney stones 01/2016    There are no active problems to display for this patient.   Past Surgical History:  Procedure Laterality Date  . APPENDECTOMY    . CYSTOSCOPY W/ RETROGRADES  05/21/2016   Procedure: CYSTOSCOPY WITH RETROGRADE PYELOGRAM;  Surgeon: Festus Aloe, MD;  Location: [facility] ORS;  Service: Urology;;  . KNEE SURGERY Left   . TRANSURETHRAL RESECTION OF BLADDER TUMOR N/A 05/21/2016   Procedure: TRANSURETHRAL RESECTION OF BLADDER TUMOR (TURBT);  Surgeon: Festus Aloe, MD;  Location: [facility] ORS;  Service: Urology;  Laterality: N/A;    Prior to Admission medications   Medication Sig Start Date End Date Taking? Authorizing Provider  albuterol (PROVENTIL HFA;VENTOLIN HFA) 108 (90 Base) MCG/ACT inhaler Inhale 2 puffs into the lungs every 6 (six) hours as needed for wheezing or  shortness of breath. Patient not taking: Reported on 09/13/2016 09/02/16   Laban Emperor, PA-C  azithromycin (ZITHROMAX Z-PAK) 250 MG tablet Take 2 tablets (500 mg) on  Day 1,  followed by 1 tablet (250 mg) once daily on Days 2 through 5. Patient not taking: Reported on 09/13/2016 09/02/16   Laban Emperor, PA-C  guaiFENesin-codeine 100-10 MG/5ML syrup Take 10 mLs by mouth 3 (three) times daily as needed. Patient not taking: Reported on 09/13/2016 08/23/16   Sherrie George B, FNP  ibuprofen (ADVIL,MOTRIN) 800 MG tablet Take 1 tablet (800 mg total) by mouth every 8 (eight) hours as needed for moderate pain. 10/15/16   Paulette Blanch, MD  nitrofurantoin (MACRODANTIN) 100 MG capsule Take 1 capsule (100 mg total) by mouth at bedtime. Patient not taking: Reported on 06/04/2016 05/21/16   Festus Aloe, MD  oxyCODONE-acetaminophen (ROXICET) 5-325 MG tablet Take 1 tablet by mouth every 4 (four) hours as needed for severe pain. 10/15/16   Paulette Blanch, MD  polyethylene glycol [facility]) packet Take 17 g by mouth daily as needed (constipation). 09/13/16   [name], Randall An, MD  predniSONE (STERAPRED UNI-PAK 21 TAB) 10 MG (21) TBPK tablet Take 6 tablets on day 1, take 5 tablets on day 2, take 4 tablets on day 3, take 3 tablets on day 4, take 2 tablets on day 5, take 1 tablet on day 6 Patient not taking: Reported on 09/13/2016 09/02/16   Laban Emperor, PA-C    Allergies Bee venom; Penicillins; and Tramadol  Family History  Problem Relation Age of Onset  . Diabetes Mother   .  Prostate cancer Father   CAD  Social History Social History  Substance Use Topics  . Smoking status: Current Every Day Smoker    Packs/day: 1.00    Types: Cigarettes  . Smokeless tobacco: Never Used  . Alcohol use No    Review of Systems  Constitutional: No fever/chills. Eyes: No visual changes. ENT: No sore throat. Cardiovascular: Positive for chest pain. Respiratory: Denies shortness of breath. Gastrointestinal: No  abdominal pain.  Positive for nausea, no vomiting.  No diarrhea.  No constipation. Genitourinary: Negative for dysuria. Musculoskeletal: Negative for back pain. Skin: Negative for rash. Neurological: Negative for headaches, focal weakness or numbness.   ____________________________________________   PHYSICAL EXAM:  VITAL SIGNS: ED Triage Vitals  Enc Vitals Group     BP 10/14/16 2240 (!) 142/90     Pulse Rate 10/14/16 2240 65     Resp 10/14/16 2240 18     Temp 10/14/16 2240 98.2 F (36.8 C)     Temp Source 10/14/16 2240 Oral     SpO2 10/14/16 2240 97 %     Weight 10/14/16 2236 207 lb (93.9 kg)     Height 10/14/16 2236 6\' 4"  (1.93 m)     Head Circumference --      Peak Flow --      Pain Score 10/14/16 2236 8     Pain Loc --      Pain Edu? --      Excl. in [place]? --     Constitutional: Asleep, easily awakened for exam. Alert and oriented. Well appearing and in no acute distress. Eyes: Conjunctivae are normal. PERRL. EOMI. Head: Atraumatic. Nose: No congestion/rhinnorhea. Mouth/Throat: Mucous membranes are moist.  Oropharynx non-erythematous. Neck: No stridor.  No carotid bruits. Cardiovascular: Normal rate, regular rhythm. Grossly normal heart sounds.  Good peripheral circulation. Respiratory: Normal respiratory effort.  No retractions. Lungs CTAB. Anterior chest tender to palpation. Gastrointestinal: Soft and nontender. No distention. No abdominal bruits. No CVA tenderness. Musculoskeletal: No lower extremity tenderness nor edema.  No joint effusions. Neurologic:  Normal speech and language. No gross focal neurologic deficits are appreciated. No gait instability. Skin:  Skin is warm, dry and intact. No rash noted. Psychiatric: Mood and affect are normal. Speech and behavior are normal.  ____________________________________________   LABS (all labs ordered are listed, but only abnormal results are displayed)  Labs Reviewed  BASIC METABOLIC PANEL - Abnormal; Notable for  the following:       Result Value   Glucose, Bld 111 (*)    BUN 25 (*)    Creatinine, Ser 1.25 (*)    All other components within normal limits  CBC  TROPONIN I  URINE DRUG SCREEN, QUALITATIVE ([facility] ONLY)  TROPONIN I   ____________________________________________  EKG  ED ECG REPORT I, Grover Robinson J, the attending physician, personally viewed and interpreted this ECG.   Date: 10/15/2016  EKG Time: 2235  Rate: 67  Rhythm: normal EKG, normal sinus rhythm  Axis: normal  Intervals:none  ST&T Change: nonspecific  ____________________________________________  RADIOLOGY  Chest x-ray interpreted per Dr. Radene Knee: No acute cardiopulmonary process seen. ____________________________________________   PROCEDURES  Procedure(s) performed: None  Procedures  Critical Care performed: No  ____________________________________________   INITIAL IMPRESSION / ASSESSMENT AND PLAN / ED COURSE  Pertinent labs & imaging results that were available during my care of the patient were reviewed by me and considered in my medical decision making (see chart for details).  51 year old male with a history of hepatitis who presents  with chest pain; recent URI symptoms. Initial EKG and troponin are unremarkable. Will administer analgesia and repeat timed troponin. Low suspicion for ACS, PE, dissection.  ----------------------------------------- 1:35 AM on 10/15/2016 -----------------------------------------   OBSERVATION CARE: This patient is being placed under observation care for the following reasons: Chest pain with repeat testing to rule out ischemia     Clinical Course as of Oct 15 324  Fri Oct 15, 2016  0322 Patient resting comfortably in no acute distress. Updated him of negative repeat troponin. Reports chest pain significantly improved after Toradol and Percocet. Refer to cardiology for outpatient follow-up. Strict return precautions given. Patient verbalizes understanding and  agrees with plan of care.  [JS]    Clinical Course User Index [JS] Paulette Blanch, MD   ----------------------------------------- 3:24 AM on 10/15/2016 -----------------------------------------   END OF OBSERVATION STATUS: After an appropriate period of observation, this patient is being discharged due to the following reason(s):  Repeat troponin remains negative, overall clinical improvement in symptoms.  ____________________________________________   FINAL CLINICAL IMPRESSION(S) / ED DIAGNOSES  Final diagnoses:  Chest pain, unspecified type  Pleurisy      NEW MEDICATIONS STARTED DURING THIS VISIT:  New Prescriptions   IBUPROFEN (ADVIL,MOTRIN) 800 MG TABLET    Take 1 tablet (800 mg total) by mouth every 8 (eight) hours as needed for moderate pain.   OXYCODONE-ACETAMINOPHEN (ROXICET) 5-325 MG TABLET    Take 1 tablet by mouth every 4 (four) hours as needed for severe pain.     Note:  This document was prepared using Dragon voice recognition software and may include unintentional dictation errors.    Paulette Blanch, MD 10/15/16 [phone]

## 2016-10-15 NOTE — ED Notes (Signed)
Pt voided 450 clear yellow urine in urinal; sample to lab as ordered

## 2016-10-15 NOTE — Discharge Instructions (Signed)
1. You may take pain medicines as needed (Motrin/Percocet). 2. Apply moist heat to affected area several times daily. 3. Return to the ER for worsening symptoms, persistent vomiting, difficulty breathing or other concerns.

## 2016-10-15 NOTE — ED Notes (Signed)
Pt escorted to lobby to await for transport back to [facility], pt ambulatory without difficulty

## 2016-10-15 NOTE — ED Notes (Signed)
Pt uprite on stretcher in exam room with no distress noted; pt reports sudden onset mid CP at 9pm radiating into left arm accomp by nausea and diaphoresis; denies hx of same; resp even/unlab, lungs clear, apical audible & regular, +BS, abd soft/nondist, +PP, -edema; card monitor in place

## 2016-10-19 ENCOUNTER — Ambulatory Visit: Payer: Self-pay

## 2017-01-10 ENCOUNTER — Emergency Department
Admission: EM | Admit: 2017-01-10 | Discharge: 2017-01-10 | Disposition: A | Discharge status: Home / Self Care | Payer: Self-pay | Attending: Emergency Medicine | Admitting: Emergency Medicine

## 2017-01-10 ENCOUNTER — Emergency Department: Payer: Self-pay

## 2017-01-10 ENCOUNTER — Encounter: Payer: Self-pay | Admitting: Medical Oncology

## 2017-01-10 DIAGNOSIS — S20212D Contusion of left front wall of thorax, subsequent encounter: Secondary | ICD-10-CM | POA: Insufficient documentation

## 2017-01-10 DIAGNOSIS — Y929 Unspecified place or not applicable: Secondary | ICD-10-CM | POA: Insufficient documentation

## 2017-01-10 DIAGNOSIS — F17210 Nicotine dependence, cigarettes, uncomplicated: Secondary | ICD-10-CM | POA: Insufficient documentation

## 2017-01-10 DIAGNOSIS — S20212A Contusion of left front wall of thorax, initial encounter: Secondary | ICD-10-CM

## 2017-01-10 DIAGNOSIS — Y939 Activity, unspecified: Secondary | ICD-10-CM | POA: Insufficient documentation

## 2017-01-10 DIAGNOSIS — Z79899 Other long term (current) drug therapy: Secondary | ICD-10-CM | POA: Insufficient documentation

## 2017-01-10 DIAGNOSIS — Y999 Unspecified external cause status: Secondary | ICD-10-CM | POA: Insufficient documentation

## 2017-01-10 DIAGNOSIS — W1789XA Other fall from one level to another, initial encounter: Secondary | ICD-10-CM | POA: Insufficient documentation

## 2017-01-10 MED ORDER — OXYCODONE-ACETAMINOPHEN 5-325 MG PO TABS
1.0000 | ORAL_TABLET | Freq: Four times a day (QID) | ORAL | 0 refills | Status: DC | PRN
Start: 2017-01-10 — End: 2017-06-19

## 2017-01-10 MED ORDER — MELOXICAM 15 MG PO TABS: 15.0000 mg | ORAL_TABLET | Freq: Every day | ORAL | 0 refills | Status: DC

## 2017-01-10 NOTE — ED Triage Notes (Signed)
Pt reports he was working Saturday and fell on a pole to his left rib cage and since then has been having pain to area. Pt in NAD. Pain with movement. Denies sob.

## 2017-01-10 NOTE — ED Provider Notes (Signed)
[facility] Emergency Department Provider Note ____________________________________________  Time seen: Approximately 4:59 PM  I have reviewed the triage vital signs and the nursing notes.   HISTORY  Chief Complaint Rib Injury    HPI Carlos Holland is a 51 y.o. male who presents to the emergency department for evaluation and treatment of left rib pain. He states that he fell from a truck on Saturday and hit his ribs on a pole. He felt like the pole "stretched something in his side." He has taken ibuprofen and Tylenol without relief. He denies shortness of breath. He does state that the pain increases with deep inspiration.  Past Medical History:  Diagnosis Date  . Hepatitis    hepatitis c  . History of kidney stones 01/2016    There are no active problems to display for this patient.   Past Surgical History:  Procedure Laterality Date  . APPENDECTOMY    . CYSTOSCOPY W/ RETROGRADES  05/21/2016   Procedure: CYSTOSCOPY WITH RETROGRADE PYELOGRAM;  Surgeon: Festus Aloe, MD;  Location: [facility] ORS;  Service: Urology;;  . KNEE SURGERY Left   . TRANSURETHRAL RESECTION OF BLADDER TUMOR N/A 05/21/2016   Procedure: TRANSURETHRAL RESECTION OF BLADDER TUMOR (TURBT);  Surgeon: Festus Aloe, MD;  Location: [facility] ORS;  Service: Urology;  Laterality: N/A;    Prior to Admission medications   Medication Sig Start Date End Date Taking? Authorizing Provider  albuterol (PROVENTIL HFA;VENTOLIN HFA) 108 (90 Base) MCG/ACT inhaler Inhale 2 puffs into the lungs every 6 (six) hours as needed for wheezing or shortness of breath. Patient not taking: Reported on 09/13/2016 09/02/16   Laban Emperor, PA-C  azithromycin (ZITHROMAX Z-PAK) 250 MG tablet Take 2 tablets (500 mg) on  Day 1,  followed by 1 tablet (250 mg) once daily on Days 2 through 5. Patient not taking: Reported on 09/13/2016 09/02/16   Laban Emperor, PA-C  guaiFENesin-codeine 100-10 MG/5ML syrup Take 10 mLs by mouth 3  (three) times daily as needed. Patient not taking: Reported on 09/13/2016 08/23/16   Sherrie George B, FNP  ibuprofen (ADVIL,MOTRIN) 800 MG tablet Take 1 tablet (800 mg total) by mouth every 8 (eight) hours as needed for moderate pain. 10/15/16   Paulette Blanch, MD  meloxicam (MOBIC) 15 MG tablet Take 1 tablet (15 mg total) by mouth daily. 01/10/17   Jeanni Allshouse, [name], FNP  nitrofurantoin (MACRODANTIN) 100 MG capsule Take 1 capsule (100 mg total) by mouth at bedtime. Patient not taking: Reported on 06/04/2016 05/21/16   Festus Aloe, MD  oxyCODONE-acetaminophen (ROXICET) 5-325 MG tablet Take 1 tablet by mouth every 6 (six) hours as needed. 01/10/17   Sinclaire Artiga, [name], FNP  polyethylene glycol (MIRALAX) packet Take 17 g by mouth daily as needed (constipation). 09/13/16   [name], Randall An, MD  predniSONE (STERAPRED UNI-PAK 21 TAB) 10 MG (21) TBPK tablet Take 6 tablets on day 1, take 5 tablets on day 2, take 4 tablets on day 3, take 3 tablets on day 4, take 2 tablets on day 5, take 1 tablet on day 6 Patient not taking: Reported on 09/13/2016 09/02/16   Laban Emperor, PA-C    Allergies Bee venom; Penicillins; and Tramadol  Family History  Problem Relation Age of Onset  . Diabetes Mother   . Prostate cancer Father     Social History Social History  Substance Use Topics  . Smoking status: Current Every Day Smoker    Packs/day: 1.00    Types: Cigarettes  . Smokeless  tobacco: Never Used  . Alcohol use No    Review of Systems Constitutional: Negative for recent illness. Cardiovascular: Negative for chest pain. Respiratory: Negative for shortness of breath. Musculoskeletal: Positive for left thoracic chest wall pain. Skin: Negative for rash, lesion, or wound.  Neurological: Negative for loss of consciousness  ____________________________________________   PHYSICAL EXAM:  VITAL SIGNS: ED Triage Vitals [01/10/17 1623]  Enc Vitals Group     BP 137/77     Pulse Rate 82     Resp 16      Temp      Temp src      SpO2 99 %     Weight 217 lb (98.4 kg)     Height 6\' 4"  (1.93 m)     Head Circumference      Peak Flow      Pain Score 8     Pain Loc      Pain Edu?      Excl. in [place]?     Constitutional: Alert and oriented. Well appearing and in no acute distress. Eyes: Conjunctivae are clear without discharge or drainage.  Head: Atraumatic Neck: Active range of motion observed. Respiratory: Audible breath sounds throughout. Expiratory wheeze noted on the left lower lobe Musculoskeletal: No flail segment of the left thoracic chest on exam. Focal tenderness is noted over the lateral aspect of the left chest wall. Neurologic: Awake, alert, oriented 4.  Skin: Intact  Psychiatric: Behavior and affect are appropriate.  ____________________________________________   LABS (all labs ordered are listed, but only abnormal results are displayed)  Labs Reviewed - No data to display ____________________________________________  RADIOLOGY  No acute bony abnormality is noted on images of the left rib or chest. ____________________________________________   PROCEDURES  Procedure(s) performed: None  ____________________________________________   INITIAL IMPRESSION / ASSESSMENT AND PLAN / ED COURSE  Carlos Holland is a 51 y.o. male who presents to the emergency department for treatment and evaluation of traumatic left rib pain. He fell off the back of a truck 2 days ago and struck his ribs on a metal pole. X-ray does not show any acute fractures. He will be treated with meloxicam and a very short course of Percocet. He was instructed to follow-up with the primary care provider of his choice for symptoms that are not improving over the next few days or return to the emergency department for symptoms that change or worsen and he is unable to schedule an appointment.  Pertinent labs & imaging results that were available during my care of the patient were reviewed by me and  considered in my medical decision making (see chart for details).  _________________________________________   FINAL CLINICAL IMPRESSION(S) / ED DIAGNOSES  Final diagnoses:  Rib contusion, left, initial encounter    Discharge Medication List as of 01/10/2017  6:47 PM    START taking these medications   Details  meloxicam (MOBIC) 15 MG tablet Take 1 tablet (15 mg total) by mouth daily., Starting Mon 01/10/2017, Print        If controlled substance prescribed during this visit, 12 month history viewed on the [facility] prior to issuing an initial prescription for Schedule II or III opiod.    Victorino Dike, FNP 01/10/17 [place], [place], MD 01/12/17 [phone]

## 2017-01-10 NOTE — ED Notes (Signed)
See triage note  States he fell from truck on Saturday  Hit left rib area  conts to have pain to left lateral rib area

## 2017-05-25 ENCOUNTER — Encounter: Payer: Self-pay | Admitting: Emergency Medicine

## 2017-05-25 ENCOUNTER — Emergency Department
Admission: EM | Admit: 2017-05-25 | Discharge: 2017-05-25 | Disposition: A | Discharge status: Home / Self Care | Payer: Self-pay | Attending: Emergency Medicine | Admitting: Emergency Medicine

## 2017-05-25 DIAGNOSIS — Z791 Long term (current) use of non-steroidal anti-inflammatories (NSAID): Secondary | ICD-10-CM | POA: Insufficient documentation

## 2017-05-25 DIAGNOSIS — T1591XA Foreign body on external eye, part unspecified, right eye, initial encounter: Secondary | ICD-10-CM

## 2017-05-25 DIAGNOSIS — Y990 Civilian activity done for income or pay: Secondary | ICD-10-CM | POA: Insufficient documentation

## 2017-05-25 DIAGNOSIS — S0501XA Injury of conjunctiva and corneal abrasion without foreign body, right eye, initial encounter: Secondary | ICD-10-CM

## 2017-05-25 DIAGNOSIS — F17210 Nicotine dependence, cigarettes, uncomplicated: Secondary | ICD-10-CM | POA: Insufficient documentation

## 2017-05-25 DIAGNOSIS — Y9389 Activity, other specified: Secondary | ICD-10-CM | POA: Insufficient documentation

## 2017-05-25 DIAGNOSIS — W311XXA Contact with metalworking machines, initial encounter: Secondary | ICD-10-CM | POA: Insufficient documentation

## 2017-05-25 DIAGNOSIS — Y9261 Building [any] under construction as the place of occurrence of the external cause: Secondary | ICD-10-CM | POA: Insufficient documentation

## 2017-05-25 DIAGNOSIS — T1501XA Foreign body in cornea, right eye, initial encounter: Secondary | ICD-10-CM | POA: Insufficient documentation

## 2017-05-25 MED ORDER — ERYTHROMYCIN 5 MG/GM OP OINT: TOPICAL_OINTMENT | Freq: Three times a day (TID) | OPHTHALMIC | 0 refills | Status: AC

## 2017-05-25 MED ORDER — TETRACAINE HCL 0.5 % OP SOLN
2.0000 [drp] | Freq: Once | OPHTHALMIC | Status: AC
  Administered 2017-05-25: 2 [drp] via OPHTHALMIC
  Filled 2017-05-25: qty 4

## 2017-05-25 MED ORDER — KETOROLAC TROMETHAMINE 0.5 % OP SOLN: 1.0000 [drp] | Freq: Four times a day (QID) | OPHTHALMIC | 0 refills | Status: AC

## 2017-05-25 MED ORDER — ERYTHROMYCIN 5 MG/GM OP OINT
TOPICAL_OINTMENT | Freq: Once | OPHTHALMIC | Status: AC
  Administered 2017-05-25: 1 via OPHTHALMIC
  Filled 2017-05-25: qty 1

## 2017-05-25 MED ORDER — OXYCODONE-ACETAMINOPHEN 5-325 MG PO TABS
1.0000 | ORAL_TABLET | Freq: Once | ORAL | Status: AC
Start: 2017-05-25 — End: 2017-05-25
  Administered 2017-05-25: 1 via ORAL
  Filled 2017-05-25: qty 1

## 2017-05-25 NOTE — ED Triage Notes (Signed)
Pt reports getting metal pieces in his left eye today. Pt reports blurry vision in the affected eye. 2 black dots seen in left eye on assessment.

## 2017-05-25 NOTE — ED Notes (Signed)
Pt states he got some metal in his right eye at work today. Symptoms included Red, watery, and painful. Family at bedside.

## 2017-05-25 NOTE — Discharge Instructions (Signed)
Please call in the morning to schedule a follow up with the ophthalmologist. Return to the ER for symptoms of concern if unable to schedule an appointment.

## 2017-05-25 NOTE — ED Provider Notes (Signed)
[facility] Emergency Department Provider Note ____________________________________________  Time seen: Approximately 11:55 PM  I have reviewed the triage vital signs and the nursing notes.   HISTORY  Chief Complaint Foreign Body in Eye   HPI Carlos Holland is a 52 y.o. male who presents to the emergency department for treatment and evaluation of right eye pain.  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  He states that he was wearing his safety glasses but still managed to get the metal in his eye.  He states that the eye is painful and irritated.  No vision changes.  He has attempted irrigating the without relief. Past Medical History:  Diagnosis Date  . Hepatitis    hepatitis c  . History of kidney stones 01/2016    There are no active problems to display for this patient.   Past Surgical History:  Procedure Laterality Date  . APPENDECTOMY    . CYSTOSCOPY W/ RETROGRADES  05/21/2016   Procedure: CYSTOSCOPY WITH RETROGRADE PYELOGRAM;  Surgeon: Festus Aloe, MD;  Location: [facility] ORS;  Service: Urology;;  . KNEE SURGERY Left   . TRANSURETHRAL RESECTION OF BLADDER TUMOR N/A 05/21/2016   Procedure: TRANSURETHRAL RESECTION OF BLADDER TUMOR (TURBT);  Surgeon: Festus Aloe, MD;  Location: [facility] ORS;  Service: Urology;  Laterality: N/A;    Prior to Admission medications   Medication Sig Start Date End Date Taking? Authorizing Provider  albuterol (PROVENTIL HFA;VENTOLIN HFA) 108 (90 Base) MCG/ACT inhaler Inhale 2 puffs into the lungs every 6 (six) hours as needed for wheezing or shortness of breath. Patient not taking: Reported on 09/13/2016 09/02/16   Laban Emperor, PA-C  azithromycin (ZITHROMAX Z-PAK) 250 MG tablet Take 2 tablets (500 mg) on  Day 1,  followed by 1 tablet (250 mg)  once daily on Days 2 through 5. Patient not taking: Reported on 09/13/2016 09/02/16   Laban Emperor, PA-C  erythromycin [facility]) ophthalmic ointment Place into both eyes 3 (three) times daily for 10 days. Place a 1/2 inch ribbon of ointment into the lower eyelid. 05/25/17 06/04/17  [name], [name], FNP  guaiFENesin-codeine 100-10 MG/5ML syrup Take 10 mLs by mouth 3 (three) times daily as needed. Patient not taking: Reported on 09/13/2016 08/23/16   Sherrie George B, FNP  ibuprofen (ADVIL,MOTRIN) 800 MG tablet Take 1 tablet (800 mg total) by mouth every 8 (eight) hours as needed for moderate pain. 10/15/16   Paulette Blanch, MD  ketorolac (ACULAR) 0.5 % ophthalmic solution Place 1 drop into the right eye 4 (four) times daily. 05/25/17   Isidoro Santillana, [name], FNP  meloxicam (MOBIC) 15 MG tablet Take 1 tablet (15 mg total) by mouth daily. 01/10/17   [name], [name], FNP  nitrofurantoin (MACRODANTIN) 100 MG capsule Take 1 capsule (100 mg total) by mouth at bedtime. Patient not taking: Reported on 06/04/2016 05/21/16   Festus Aloe, MD  oxyCODONE-acetaminophen (ROXICET) 5-325 MG tablet Take 1 tablet by mouth every 6 (six) hours as needed. 01/10/17   [name], [name], FNP  polyethylene glycol (MIRALAX) packet Take 17 g by mouth daily as needed (constipation). 09/13/16   [name], Randall An, MD  predniSONE (STERAPRED UNI-PAK 21 TAB) 10 MG (21) TBPK tablet Take 6 tablets on day 1, take 5 tablets on day 2, take 4 tablets  on day 3, take 3 tablets on day 4, take 2 tablets on day 5, take 1 tablet on day 6 Patient not taking: Reported on 09/13/2016 09/02/16   Laban Emperor, PA-C    Allergies Bee venom; Penicillins; and Tramadol  Family History  Problem Relation Age of Onset  . Diabetes Mother   . Prostate cancer Father     Social History Social History   Tobacco Use  . Smoking status: Current Every Day Smoker    Packs/day: 1.00    Types: Cigarettes  . Smokeless tobacco: Never Used  Substance Use Topics  .  Alcohol use: No  . Drug use: No    Review of Systems   Constitutional: No fever/chills Eyes: Negative for visual changes.  Positive for pain. Musculoskeletal: Negative for pain. Skin: Negative for rash. Neurological: Negative for headaches, focal weakness or numbness. Allergic: Negative for seasonal allergies. ____________________________________________  PHYSICAL EXAM:  VITAL SIGNS: ED Triage Vitals  Enc Vitals Group     BP 05/25/17 2136 (!) 158/89     Pulse Rate 05/25/17 2136 63     Resp 05/25/17 2136 16     Temp 05/25/17 2136 97.6 F (36.4 C)     Temp Source 05/25/17 2136 Oral     SpO2 05/25/17 2136 97 %     Weight 05/25/17 2136 208 lb (94.3 kg)     Height 05/25/17 2136 6\' 4"  (1.93 m)     Head Circumference --      Peak Flow --      Pain Score 05/25/17 2251 8     Pain Loc --      Pain Edu? --      Excl. in [place]? --     Constitutional: Alert and oriented. Well appearing and in no acute distress. Eyes: Visual acuity--see nursing documentation; no globe trauma; Eyelids normal to inspection; Sclera appears anicteric.  Eyelids were inverted. Conjunctiva appears injected and mildly erythematous; Cornea with obvious foreign body. Head: Atraumatic. Nose: No congestion/rhinnorhea. Mouth/Throat: Mucous membranes are moist.  Oropharynx non-erythematous. Respiratory: Respirations even and unlabored. Musculoskeletal:Normal ROM x 4 extremities. Neurologic:  Normal speech and language. No gross focal neurologic deficits are appreciated. Speech is normal. No gait instability. Skin:  Skin is warm, dry and intact. No rash noted. Psychiatric: Mood and affect are normal. Speech and behavior are normal.  ____________________________________________   LABS (all labs ordered are listed, but only abnormal results are displayed)  Labs Reviewed - No data to display ____________________________________________  EKG  Not  indicated. ____________________________________________  RADIOLOGY  Not indicated. ____________________________________________   PROCEDURES  Procedure(s) performed: Tetricane inserted into the right eye with successful anesthesia.  A sterile cotton swab was then used to attempt to remove the metal flecks.  1 was successfully removed, however the second was embedded more deeply.  A sterile insulin syringe needle was then used to remove the second fleck.  A small puncture was left as well as a small corneal abrasion however it appeared that the entire foreign body was successfully removed. ____________________________________________   INITIAL IMPRESSION / ASSESSMENT AND PLAN / ED COURSE  52 year old male presenting to the emergency department for evaluation and treatment of metal foreign body to the right.  Successful foreign body removal achieved.  Patient was given strict follow-up information to call and schedule an appointment with ophthalmology for recheck.  Erythromycin ointment was applied to the eye tonight.  He was also given a prescription for the same as well as and Acular eyedrop.  He is to return to the emergency department for symptoms of change or worsen if he is unable to schedule an appointment with ophthalmology.  Pertinent labs & imaging results that were available during my care of the patient were reviewed by me and considered in my medical decision making (see chart for details). ____________________________________________   FINAL CLINICAL IMPRESSION(S) / ED DIAGNOSES  Final diagnoses:  Eye foreign body, right, initial encounter  Abrasion of right cornea, initial encounter    Note:  This document was prepared using Dragon voice recognition software and may include unintentional dictation errors.    Victorino Dike, FNP 05/26/17 [place]as    Carrie Mew, MD 05/28/17 [phone]

## 2017-06-19 ENCOUNTER — Other Ambulatory Visit: Payer: Self-pay

## 2017-06-19 ENCOUNTER — Emergency Department
Admission: EM | Admit: 2017-06-19 | Discharge: 2017-06-19 | Disposition: A | Discharge status: Home / Self Care | Payer: Self-pay | Attending: Emergency Medicine | Admitting: Emergency Medicine

## 2017-06-19 ENCOUNTER — Encounter: Payer: Self-pay | Admitting: Emergency Medicine

## 2017-06-19 DIAGNOSIS — F17210 Nicotine dependence, cigarettes, uncomplicated: Secondary | ICD-10-CM | POA: Insufficient documentation

## 2017-06-19 DIAGNOSIS — Z79899 Other long term (current) drug therapy: Secondary | ICD-10-CM | POA: Insufficient documentation

## 2017-06-19 DIAGNOSIS — Y999 Unspecified external cause status: Secondary | ICD-10-CM | POA: Insufficient documentation

## 2017-06-19 DIAGNOSIS — S46811A Strain of other muscles, fascia and tendons at shoulder and upper arm level, right arm, initial encounter: Secondary | ICD-10-CM | POA: Insufficient documentation

## 2017-06-19 DIAGNOSIS — Y939 Activity, unspecified: Secondary | ICD-10-CM | POA: Insufficient documentation

## 2017-06-19 DIAGNOSIS — Y929 Unspecified place or not applicable: Secondary | ICD-10-CM | POA: Insufficient documentation

## 2017-06-19 DIAGNOSIS — X503XXA Overexertion from repetitive movements, initial encounter: Secondary | ICD-10-CM | POA: Insufficient documentation

## 2017-06-19 MED ORDER — MELOXICAM 15 MG PO TABS: 15.0000 mg | ORAL_TABLET | Freq: Every day | ORAL | 1 refills | Status: AC

## 2017-06-19 MED ORDER — CYCLOBENZAPRINE HCL 10 MG PO TABS: 10.0000 mg | ORAL_TABLET | Freq: Three times a day (TID) | ORAL | 0 refills | Status: AC | PRN

## 2017-06-19 NOTE — ED Triage Notes (Signed)
Pain under right scapula since Wednesday last week.  . When turns head to left causes pain to scapula. Movement of right shoulder also increased pain.  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

## 2017-06-19 NOTE — ED Notes (Signed)
See triage note  Presents with pain to posterior right shoulder  States pain radiates from neck into scapula area   Denies any injury  Pain startedabout 3 days ago

## 2017-06-19 NOTE — ED Provider Notes (Signed)
[facility] Emergency Department Provider Note ____________________________________________  Time seen: Approximately 10:46 AM  I have reviewed the triage vital signs and the nursing notes.   HISTORY  Chief Complaint Shoulder Pain    HPI Carlos Holland is a 52 y.o. male who presents to the emergency department for treatment of evaluation of pain in the right upper back/neck since Wednesday. 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 No specific point of onset or injury. No relief with ibuprofen.    Past Medical History:  Diagnosis Date  . Hepatitis    hepatitis c  . History of kidney stones 01/2016    There are no active problems to display for this patient.   Past Surgical History:  Procedure Laterality Date  . APPENDECTOMY    . CYSTOSCOPY W/ RETROGRADES  05/21/2016   Procedure: CYSTOSCOPY WITH RETROGRADE PYELOGRAM;  Surgeon: Festus Aloe, MD;  Location: [facility] ORS;  Service: Urology;;  . KNEE SURGERY Left   . TRANSURETHRAL RESECTION OF BLADDER TUMOR N/A 05/21/2016   Procedure: TRANSURETHRAL RESECTION OF BLADDER TUMOR (TURBT);  Surgeon: Festus Aloe, MD;  Location: [facility] ORS;  Service: Urology;  Laterality: N/A;    Prior to Admission medications   Medication Sig Start Date End Date Taking? Authorizing Provider  albuterol (PROVENTIL HFA;VENTOLIN HFA) 108 (90 Base) MCG/ACT inhaler Inhale 2 puffs into the lungs every 6 (six) hours as needed for wheezing or shortness of breath. Patient not taking: Reported on 09/13/2016 09/02/16   Laban Emperor, PA-C  azithromycin (ZITHROMAX Z-PAK) 250 MG tablet Take 2 tablets (500 mg) on  Day 1,  followed by 1 tablet (250 mg) once daily on Days 2 through 5. Patient not taking: Reported on 09/13/2016 09/02/16   Laban Emperor, PA-C  cyclobenzaprine (FLEXERIL) 10 MG tablet Take 1 tablet (10 mg total) by mouth 3 (three) times daily as needed for muscle spasms. 06/19/17   Imad Shostak B, FNP   guaiFENesin-codeine 100-10 MG/5ML syrup Take 10 mLs by mouth 3 (three) times daily as needed. Patient not taking: Reported on 09/13/2016 08/23/16   Sherrie George B, FNP  ibuprofen (ADVIL,MOTRIN) 800 MG tablet Take 1 tablet (800 mg total) by mouth every 8 (eight) hours as needed for moderate pain. 10/15/16   Paulette Blanch, MD  ketorolac (ACULAR) 0.5 % ophthalmic solution Place 1 drop into the right eye 4 (four) times daily. 05/25/17   Mariadelosang Wynns, [name], FNP  meloxicam (MOBIC) 15 MG tablet Take 1 tablet (15 mg total) by mouth daily. 06/19/17   Calib Wadhwa, [name], FNP  nitrofurantoin (MACRODANTIN) 100 MG capsule Take 1 capsule (100 mg total) by mouth at bedtime. Patient not taking: Reported on 06/04/2016 05/21/16   Festus Aloe, MD  polyethylene glycol [facility]) packet Take 17 g by mouth daily as needed (constipation). 09/13/16   [name], Randall An, MD  predniSONE (STERAPRED UNI-PAK 21 TAB) 10 MG (21) TBPK tablet Take 6 tablets on day 1, take 5 tablets on day 2, take 4 tablets on day 3, take 3 tablets on day 4, take 2 tablets on day 5, take 1 tablet on day 6 Patient not taking: Reported on 09/13/2016 09/02/16   Laban Emperor, PA-C    Allergies Bee venom; Penicillins; and Tramadol  Family History  Problem Relation Age of Onset  . Diabetes Mother   . Prostate cancer Father     Social History Social History   Tobacco Use  . Smoking status: Current Every Day  Smoker    Packs/day: 1.00    Types: Cigarettes  . Smokeless tobacco: Never Used  Substance Use Topics  . Alcohol use: No  . Drug use: No    Review of Systems Constitutional: Negative for fever. Cardiovascular: Negative for chest pain. Respiratory: Negative for cough or shortness of breath. Musculoskeletal: Positive for right upper back, neck, and shoulder pain Skin: Negative for rash, lesion, or wound.  Neurological: Negative for paresthesias.  ____________________________________________   PHYSICAL EXAM:  VITAL SIGNS: ED  Triage Vitals  Enc Vitals Group     BP 06/19/17 0920 135/90     Pulse Rate 06/19/17 0920 61     Resp 06/19/17 0920 16     Temp 06/19/17 0920 98.5 F (36.9 C)     Temp Source 06/19/17 0920 Oral     SpO2 06/19/17 0920 96 %     Weight 06/19/17 0915 218 lb (98.9 kg)     Height 06/19/17 0915 6\' 4"  (1.93 m)     Head Circumference --      Peak Flow --      Pain Score 06/19/17 0915 8     Pain Loc --      Pain Edu? --      Excl. in [place]? --     Constitutional: Alert and oriented. Well appearing and in no acute distress. Eyes: Conjunctivae are clear without discharge or drainage Head: Atraumatic Neck: Supple. No tenderness on palpation over midline or paracervical muscles. Rotation induces pain in the right upper back and suprascapular area. Respiratory: Breath sounds are clear to auscultation. Musculoskeletal: Limited ROM of the right shoulder with abduction, which induces pain in the right upper back and suprascapular area. Neurologic: Sensory and motor function intact.  Skin: No rash, lesion, or wound noted over area of pain.  Psychiatric: Affect and behavior are appropriate.  ____________________________________________   LABS (all labs ordered are listed, but only abnormal results are displayed)  Labs Reviewed - No data to display ____________________________________________  RADIOLOGY  Not indicated ____________________________________________   PROCEDURES  Procedures  ____________________________________________   INITIAL IMPRESSION / ASSESSMENT AND PLAN / ED COURSE  Carlos Holland is a 52 y.o. male who presents to the emergency department for evaluation treatment of nontraumatic right upper back and suprascapular pain.  Pain is reproducible with movement.  He will be treated with meloxicam and Flexeril encouraged to follow-up with the primary care provider for choice for symptoms that are not improving over the next few days.  He was encouraged to return to the  emergency department for symptoms of change or worsen if he is unable to schedule an appointment.  Medications - No data to display  Pertinent labs & imaging results that were available during my care of the patient were reviewed by me and considered in my medical decision making (see chart for details).  _________________________________________   FINAL CLINICAL IMPRESSION(S) / ED DIAGNOSES  Final diagnoses:  Trapezius muscle strain, right, initial encounter    ED Discharge Orders        Ordered    meloxicam (MOBIC) 15 MG tablet  Daily     06/19/17 1018    cyclobenzaprine (FLEXERIL) 10 MG tablet  3 times daily PRN     06/19/17 1018       If controlled substance prescribed during this visit, 12 month history viewed on the [place] prior to issuing an initial prescription for Schedule II or III opiod.    Victorino Dike, FNP 06/19/17 1306  Delman Kitten, MD 06/19/17 2047

## 2017-08-05 ENCOUNTER — Other Ambulatory Visit: Payer: Self-pay

## 2017-08-05 ENCOUNTER — Encounter: Payer: Self-pay | Admitting: Emergency Medicine

## 2017-08-05 ENCOUNTER — Emergency Department: Payer: Self-pay

## 2017-08-05 ENCOUNTER — Emergency Department
Admission: EM | Admit: 2017-08-05 | Discharge: 2017-08-05 | Disposition: A | Discharge status: Home / Self Care | Payer: Self-pay | Attending: Emergency Medicine | Admitting: Emergency Medicine

## 2017-08-05 DIAGNOSIS — Z79899 Other long term (current) drug therapy: Secondary | ICD-10-CM | POA: Insufficient documentation

## 2017-08-05 DIAGNOSIS — R1011 Right upper quadrant pain: Secondary | ICD-10-CM | POA: Insufficient documentation

## 2017-08-05 DIAGNOSIS — F17210 Nicotine dependence, cigarettes, uncomplicated: Secondary | ICD-10-CM | POA: Insufficient documentation

## 2017-08-05 LAB — CBC
HCT: 44.1 % (ref 40.0–52.0)
HEMOGLOBIN: 14.7 g/dL (ref 13.0–18.0)
MCH: 31 pg (ref 26.0–34.0)
MCHC: 33.3 g/dL (ref 32.0–36.0)
MCV: 93.1 fL (ref 80.0–100.0)
Platelets: 187 10*3/uL (ref 150–440)
RBC: 4.73 MIL/uL (ref 4.40–5.90)
RDW: 14.3 % (ref 11.5–14.5)
WBC: 9.8 10*3/uL (ref 3.8–10.6)

## 2017-08-05 LAB — COMPREHENSIVE METABOLIC PANEL
ALK PHOS: 63 U/L (ref 38–126)
ALT: 34 U/L (ref 17–63)
ANION GAP: 9 (ref 5–15)
AST: 27 U/L (ref 15–41)
Albumin: 3.9 g/dL (ref 3.5–5.0)
BILIRUBIN TOTAL: 0.7 mg/dL (ref 0.3–1.2)
BUN: 25 mg/dL — ABNORMAL HIGH (ref 6–20)
CALCIUM: 8.6 mg/dL — AB (ref 8.9–10.3)
CO2: 26 mmol/L (ref 22–32)
Chloride: 102 mmol/L (ref 101–111)
Creatinine, Ser: 1.01 mg/dL (ref 0.61–1.24)
Glucose, Bld: 103 mg/dL — ABNORMAL HIGH (ref 65–99)
Potassium: 3.7 mmol/L (ref 3.5–5.1)
Sodium: 137 mmol/L (ref 135–145)
Total Protein: 7.5 g/dL (ref 6.5–8.1)

## 2017-08-05 LAB — LIPASE, BLOOD: Lipase: 35 U/L (ref 11–51)

## 2017-08-05 LAB — URINALYSIS, COMPLETE (UACMP) WITH MICROSCOPIC
Bacteria, UA: NONE SEEN
Bilirubin Urine: NEGATIVE
GLUCOSE, UA: NEGATIVE mg/dL
Hgb urine dipstick: NEGATIVE
Ketones, ur: NEGATIVE mg/dL
Leukocytes, UA: NEGATIVE
NITRITE: NEGATIVE
PH: 5 (ref 5.0–8.0)
PROTEIN: NEGATIVE mg/dL
RBC / HPF: NONE SEEN RBC/hpf (ref 0–5)
Specific Gravity, Urine: 1.031 — ABNORMAL HIGH (ref 1.005–1.030)

## 2017-08-05 LAB — TROPONIN I: Troponin I: <0.03 ng/mL (ref <0.03)

## 2017-08-05 MED ORDER — ONDANSETRON HCL 4 MG/2ML IJ SOLN
4.0000 mg | Freq: Once | INTRAMUSCULAR | Status: AC
  Administered 2017-08-05: 4 mg via INTRAVENOUS
  Filled 2017-08-05: qty 2

## 2017-08-05 MED ORDER — MORPHINE SULFATE (PF) 4 MG/ML IV SOLN
4.0000 mg | Freq: Once | INTRAVENOUS | Status: AC
  Administered 2017-08-05: 4 mg via INTRAVENOUS
  Filled 2017-08-05: qty 1

## 2017-08-05 MED ORDER — OXYCODONE-ACETAMINOPHEN 7.5-325 MG PO TABS: 1.0000 | ORAL_TABLET | ORAL | 0 refills | Status: AC | PRN

## 2017-08-05 MED ORDER — IOPAMIDOL (ISOVUE-300) INJECTION 61%
100.0000 mL | Freq: Once | INTRAVENOUS | Status: AC | PRN
  Administered 2017-08-05: 100 mL via INTRAVENOUS

## 2017-08-05 NOTE — ED Triage Notes (Signed)
Patient ambulatory to triage with steady gait, without difficulty or distress noted; Pt st tx for Hep C, has "liver problems"; c/o right upper abd pain tonight; denies any accomp symptoms

## 2017-08-05 NOTE — ED Triage Notes (Signed)
Pt in with co ruq pain started today hx of hep c.

## 2017-08-05 NOTE — ED Provider Notes (Signed)
[facility] Emergency Department Provider Note _____________   First MD Initiated Contact with Patient 08/05/17 [phone]     (approximate)  I have reviewed the triage vital signs and the nursing notes.   HISTORY  Chief Complaint Abdominal Pain    HPI Carlos Holland is a 52 y.o. male with below list of chronic medical conditions including hepatitis and kidney stones presents to the emergency department with 9 out of 10 right upper quadrant pain with acute onset tonight.  Patient also admits to nausea no vomiting.  Patient denies any diarrhea.  Patient denies any fever.   Past Medical History:  Diagnosis Date  . Hepatitis    hepatitis c  . History of kidney stones 01/2016    There are no active problems to display for this patient.   Past Surgical History:  Procedure Laterality Date  . APPENDECTOMY    . CYSTOSCOPY W/ RETROGRADES  05/21/2016   Procedure: CYSTOSCOPY WITH RETROGRADE PYELOGRAM;  Surgeon: Festus Aloe, MD;  Location: [facility] ORS;  Service: Urology;;  . KNEE SURGERY Left   . TRANSURETHRAL RESECTION OF BLADDER TUMOR N/A 05/21/2016   Procedure: TRANSURETHRAL RESECTION OF BLADDER TUMOR (TURBT);  Surgeon: Festus Aloe, MD;  Location: [facility] ORS;  Service: Urology;  Laterality: N/A;    Prior to Admission medications   Medication Sig Start Date End Date Taking? Authorizing Provider  albuterol (PROVENTIL HFA;VENTOLIN HFA) 108 (90 Base) MCG/ACT inhaler Inhale 2 puffs into the lungs every 6 (six) hours as needed for wheezing or shortness of breath. Patient not taking: Reported on 09/13/2016 09/02/16   Laban Emperor, PA-C  azithromycin (ZITHROMAX Z-PAK) 250 MG tablet Take 2 tablets (500 mg) on  Day 1,  followed by 1 tablet (250 mg) once daily on Days 2 through 5. Patient not taking: Reported on 09/13/2016 09/02/16   Laban Emperor, PA-C  cyclobenzaprine (FLEXERIL) 10 MG tablet Take 1 tablet (10 mg total) by mouth 3 (three) times daily as needed for  muscle spasms. 06/19/17   Triplett, Cari B, FNP  guaiFENesin-codeine 100-10 MG/5ML syrup Take 10 mLs by mouth 3 (three) times daily as needed. Patient not taking: Reported on 09/13/2016 08/23/16   Sherrie George B, FNP  ibuprofen (ADVIL,MOTRIN) 800 MG tablet Take 1 tablet (800 mg total) by mouth every 8 (eight) hours as needed for moderate pain. 10/15/16   Paulette Blanch, MD  ketorolac (ACULAR) 0.5 % ophthalmic solution Place 1 drop into the right eye 4 (four) times daily. 05/25/17   Triplett, [name], FNP  meloxicam (MOBIC) 15 MG tablet Take 1 tablet (15 mg total) by mouth daily. 06/19/17   Triplett, [name], FNP  nitrofurantoin (MACRODANTIN) 100 MG capsule Take 1 capsule (100 mg total) by mouth at bedtime. Patient not taking: Reported on 06/04/2016 05/21/16   Festus Aloe, MD  polyethylene glycol [facility]) packet Take 17 g by mouth daily as needed (constipation). 09/13/16   [name], Randall An, MD  predniSONE (STERAPRED UNI-PAK 21 TAB) 10 MG (21) TBPK tablet Take 6 tablets on day 1, take 5 tablets on day 2, take 4 tablets on day 3, take 3 tablets on day 4, take 2 tablets on day 5, take 1 tablet on day 6 Patient not taking: Reported on 09/13/2016 09/02/16   Laban Emperor, PA-C    Allergies Bee venom; Penicillins; and Tramadol  Family History  Problem Relation Age of Onset  . Diabetes Mother   . Prostate cancer Father     Social History Social  History   Tobacco Use  . Smoking status: Current Every Day Smoker    Packs/day: 1.00    Types: Cigarettes  . Smokeless tobacco: Never Used  Substance Use Topics  . Alcohol use: No  . Drug use: No    Review of Systems Constitutional: No fever/chills Eyes: No visual changes. ENT: No sore throat. Cardiovascular: Denies chest pain. Respiratory: Denies shortness of breath. Gastrointestinal: Positive for right upper quadrant abdominal pain.  No nausea, no vomiting.  No diarrhea.  No constipation. Genitourinary: Negative for dysuria. Musculoskeletal:  Negative for neck pain.  Negative for back pain. Integumentary: Negative for rash. Neurological: Negative for headaches, focal weakness or numbness.   ____________________________________________   PHYSICAL EXAM:  VITAL SIGNS: ED Triage Vitals  Enc Vitals Group     BP 08/05/17 0119 (!) 163/89     Pulse Rate 08/05/17 0119 64     Resp 08/05/17 0119 18     Temp 08/05/17 0119 97.9 F (36.6 C)     Temp Source 08/05/17 0119 Oral     SpO2 08/05/17 0119 98 %     Weight 08/05/17 0116 98.9 kg (218 lb)     Height 08/05/17 0116 1.93 m (6\' 4" )     Head Circumference --      Peak Flow --      Pain Score 08/05/17 0116 8     Pain Loc --      Pain Edu? --      Excl. in [place]? --     Constitutional: Alert and oriented. Well appearing and in no acute distress. Eyes: Conjunctivae are normal.  Head: Atraumatic. Mouth/Throat: Mucous membranes are moist. Oropharynx non-erythematous. Neck: No stridor.  Cardiovascular: Normal rate, regular rhythm. Good peripheral circulation. Grossly normal heart sounds. Respiratory: Normal respiratory effort.  No retractions. Lungs CTAB. Gastrointestinal: Right upper quadrant tenderness to palpation. No distention.  Musculoskeletal: No lower extremity tenderness nor edema. No gross deformities of extremities. Neurologic:  Normal speech and language. No gross focal neurologic deficits are appreciated.  Skin:  Skin is warm, dry and intact. No rash noted. Psychiatric: Mood and affect are normal. Speech and behavior are normal.  ____________________________________________   LABS (all labs ordered are listed, but only abnormal results are displayed)  Labs Reviewed  COMPREHENSIVE METABOLIC PANEL - Abnormal; Notable for the following components:      Result Value   Glucose, Bld 103 (*)    BUN 25 (*)    Calcium 8.6 (*)    All other components within normal limits  URINALYSIS, COMPLETE (UACMP) WITH MICROSCOPIC - Abnormal; Notable for the following components:    Color, Urine YELLOW (*)    APPearance CLEAR (*)    Specific Gravity, Urine 1.031 (*)    Squamous Epithelial / LPF 0-5 (*)    All other components within normal limits  LIPASE, BLOOD  CBC  TROPONIN I   ____________________________________________  EKG  ED ECG REPORT I, [place] Emmabelle Fear, the attending physician, personally viewed and interpreted this ECG.   Date: 08/05/2017  EKG Time: 1:23 AM  Rate: 63  Rhythm: Normal sinus rhythm  Axis: Normal  Intervals: Normal  ST&T Change: None  ____________________________________________  RADIOLOGY I, [place] Betty Brooks, personally viewed and evaluated these images (plain radiographs) as part of my medical decision making, as well as reviewing the written report by the radiologist.  ED MD interpretation: No acute intra-abdominal pathology noted on CT abdomen pelvis per radiologist  Official radiology report(s): Ct Abdomen Pelvis W Contrast  Result Date: 08/05/2017 CLINICAL DATA:  Right upper quadrant and epigastric abdominal pain. EXAM: CT ABDOMEN AND PELVIS WITH CONTRAST TECHNIQUE: Multidetector CT imaging of the abdomen and pelvis was performed using the standard protocol following bolus administration of intravenous contrast. CONTRAST:  184mL ISOVUE-300 IOPAMIDOL (ISOVUE-300) INJECTION 61% COMPARISON:  CT 01/07/2016 FINDINGS: Lower chest: Mild hypoventilatory change at the lung bases. No pleural fluid. No consolidation. Hepatobiliary: No focal liver abnormality is seen. No gallstones, gallbladder wall thickening, or biliary dilatation. Pancreas: No ductal dilatation or inflammation. Spleen: Normal in size without focal abnormality. Adrenals/Urinary Tract: Normal adrenal glands. No hydronephrosis or perinephric edema. Homogeneous renal enhancement with symmetric excretion on delayed phase imaging. Parapelvic cysts in the left kidney. Urinary bladder is physiologically distended without wall thickening. Stomach/Bowel: Stomach physiologically  distended. No gastric wall thickening. Small duodenal diverticulum without inflammation. No bowel wall thickening, obstruction, or inflammatory change. Moderate colonic stool burden. Appendix not visualized, reported history of appendectomy. Vascular/Lymphatic: Mild aortic and branch atherosclerosis. Small retroperitoneal nodes not enlarged by size criteria. No pelvic adenopathy. Reproductive: Prostate is unremarkable. Other: No free air, free fluid, or intra-abdominal fluid collection. Musculoskeletal: Mild degenerative change in the lower lumbar spine. There are no acute or suspicious osseous abnormalities. IMPRESSION: 1. No acute abnormality or explanation for abdominal pain. 2.  Aortic Atherosclerosis (ICD10-I70.0). Electronically Signed   By: Jeb Levering M.D.   On: 08/05/2017 05:59      Procedures   ____________________________________________   INITIAL IMPRESSION / ASSESSMENT AND PLAN / ED COURSE  As part of my medical decision making, I reviewed the following data within the electronic MEDICAL RECORD NUMBER  52 year old male presented with above-stated history and physical exam secondary to abdominal pain.  Laboratory data unremarkable including lipase of 35 and normal liver enzymes.  CT scan of the abdomen pelvis revealed no acute intra-abdominal pathology that would explain the patient's discomfort.  Patient will be referred to gastroenterology as no clear etiology for his abdominal pain identified.     ____________________________________________  FINAL CLINICAL IMPRESSION(S) / ED DIAGNOSES  Final diagnoses:  Right upper quadrant abdominal pain     MEDICATIONS GIVEN DURING THIS VISIT:  Medications  morphine 4 MG/ML injection 4 mg (4 mg Intravenous Given 08/05/17 0530)  ondansetron (ZOFRAN) injection 4 mg (4 mg Intravenous Given 08/05/17 0528)  iopamidol (ISOVUE-300) 61 % injection 100 mL (100 mLs Intravenous Contrast Given 08/05/17 0542)     ED Discharge Orders    None         Note:  This document was prepared using Dragon voice recognition software and may include unintentional dictation errors.    Gregor Hams, MD 08/05/17 [phone]

## 2017-08-05 NOTE — ED Notes (Signed)
Patient transported to CT 

## 2017-08-09 ENCOUNTER — Other Ambulatory Visit: Payer: Self-pay

## 2017-08-09 ENCOUNTER — Encounter: Payer: Self-pay | Admitting: Emergency Medicine

## 2017-08-09 ENCOUNTER — Emergency Department
Admission: EM | Admit: 2017-08-09 | Discharge: 2017-08-09 | Disposition: A | Discharge status: Home / Self Care | Payer: Self-pay | Attending: Emergency Medicine | Admitting: Emergency Medicine

## 2017-08-09 DIAGNOSIS — Z79899 Other long term (current) drug therapy: Secondary | ICD-10-CM | POA: Insufficient documentation

## 2017-08-09 DIAGNOSIS — S61214A Laceration without foreign body of right ring finger without damage to nail, initial encounter: Secondary | ICD-10-CM | POA: Insufficient documentation

## 2017-08-09 DIAGNOSIS — Y929 Unspecified place or not applicable: Secondary | ICD-10-CM | POA: Insufficient documentation

## 2017-08-09 DIAGNOSIS — Y9389 Activity, other specified: Secondary | ICD-10-CM | POA: Insufficient documentation

## 2017-08-09 DIAGNOSIS — Z23 Encounter for immunization: Secondary | ICD-10-CM | POA: Insufficient documentation

## 2017-08-09 DIAGNOSIS — Y998 Other external cause status: Secondary | ICD-10-CM | POA: Insufficient documentation

## 2017-08-09 DIAGNOSIS — W268XXA Contact with other sharp object(s), not elsewhere classified, initial encounter: Secondary | ICD-10-CM | POA: Insufficient documentation

## 2017-08-09 DIAGNOSIS — F17210 Nicotine dependence, cigarettes, uncomplicated: Secondary | ICD-10-CM | POA: Insufficient documentation

## 2017-08-09 MED ORDER — CLINDAMYCIN HCL 300 MG PO CAPS: 300.0000 mg | ORAL_CAPSULE | Freq: Three times a day (TID) | ORAL | 0 refills | Status: AC

## 2017-08-09 MED ORDER — LIDOCAINE HCL (PF) 1 % IJ SOLN
5.0000 mL | Freq: Once | INTRAMUSCULAR | Status: DC
  Filled 2017-08-09: qty 5

## 2017-08-09 MED ORDER — TETANUS-DIPHTH-ACELL PERTUSSIS 5-2.5-18.5 LF-MCG/0.5 IM SUSP
0.5000 mL | Freq: Once | INTRAMUSCULAR | Status: AC
  Administered 2017-08-09: 0.5 mL via INTRAMUSCULAR
  Filled 2017-08-09: qty 0.5

## 2017-08-09 NOTE — ED Notes (Signed)
Pt ambulatory to POV without difficulty. VSS. NAD. Discharge instructions, RX and follow up discussed. All questions answered.  

## 2017-08-09 NOTE — ED Provider Notes (Signed)
[facility] Emergency Department Provider Note  ____________________________________________  Time seen: Approximately 5:18 PM  I have reviewed the triage vital signs and the nursing notes.   HISTORY  Chief Complaint Laceration    HPI Carlos Holland is a 52 y.o. male that presents to the emergency department for evaluation of finger laceration.  He cut his finger on a piece of steel.  No difficulty bending finger.  No numbness or tingling.  Tetanus shot is out of date.  Past Medical History:  Diagnosis Date  . Hepatitis    hepatitis c  . History of kidney stones 01/2016    There are no active problems to display for this patient.   Past Surgical History:  Procedure Laterality Date  . APPENDECTOMY    . CYSTOSCOPY W/ RETROGRADES  05/21/2016   Procedure: CYSTOSCOPY WITH RETROGRADE PYELOGRAM;  Surgeon: Festus Aloe, MD;  Location: [facility] ORS;  Service: Urology;;  . KNEE SURGERY Left   . TRANSURETHRAL RESECTION OF BLADDER TUMOR N/A 05/21/2016   Procedure: TRANSURETHRAL RESECTION OF BLADDER TUMOR (TURBT);  Surgeon: Festus Aloe, MD;  Location: [facility] ORS;  Service: Urology;  Laterality: N/A;    Prior to Admission medications   Medication Sig Start Date End Date Taking? Authorizing Provider  albuterol (PROVENTIL HFA;VENTOLIN HFA) 108 (90 Base) MCG/ACT inhaler Inhale 2 puffs into the lungs every 6 (six) hours as needed for wheezing or shortness of breath. Patient not taking: Reported on 09/13/2016 09/02/16   Laban Emperor, PA-C  azithromycin (ZITHROMAX Z-PAK) 250 MG tablet Take 2 tablets (500 mg) on  Day 1,  followed by 1 tablet (250 mg) once daily on Days 2 through 5. Patient not taking: Reported on 09/13/2016 09/02/16   Laban Emperor, PA-C  clindamycin (CLEOCIN) 300 MG capsule Take 1 capsule (300 mg total) by mouth 3 (three) times daily for 10 days. 08/09/17 08/19/17  Laban Emperor, PA-C  cyclobenzaprine (FLEXERIL) 10 MG tablet Take 1 tablet (10 mg total)  by mouth 3 (three) times daily as needed for muscle spasms. 06/19/17   Triplett, Cari B, FNP  guaiFENesin-codeine 100-10 MG/5ML syrup Take 10 mLs by mouth 3 (three) times daily as needed. Patient not taking: Reported on 09/13/2016 08/23/16   Sherrie George B, FNP  ibuprofen (ADVIL,MOTRIN) 800 MG tablet Take 1 tablet (800 mg total) by mouth every 8 (eight) hours as needed for moderate pain. 10/15/16   Paulette Blanch, MD  ketorolac (ACULAR) 0.5 % ophthalmic solution Place 1 drop into the right eye 4 (four) times daily. 05/25/17   Triplett, [name], FNP  meloxicam (MOBIC) 15 MG tablet Take 1 tablet (15 mg total) by mouth daily. 06/19/17   Triplett, [name], FNP  nitrofurantoin (MACRODANTIN) 100 MG capsule Take 1 capsule (100 mg total) by mouth at bedtime. Patient not taking: Reported on 06/04/2016 05/21/16   Festus Aloe, MD  oxyCODONE-acetaminophen (PERCOCET) 7.5-325 MG tablet Take 1 tablet by mouth every 4 (four) hours as needed for up to 8 doses for severe pain. 08/05/17   Gregor Hams, MD  polyethylene glycol [facility]) packet Take 17 g by mouth daily as needed (constipation). 09/13/16   [name], Randall An, MD  predniSONE (STERAPRED UNI-PAK 21 TAB) 10 MG (21) TBPK tablet Take 6 tablets on day 1, take 5 tablets on day 2, take 4 tablets on day 3, take 3 tablets on day 4, take 2 tablets on day 5, take 1 tablet on day 6 Patient not taking: Reported on 09/13/2016 09/02/16  Laban Emperor, PA-C    Allergies Bee venom; Penicillins; and Tramadol  Family History  Problem Relation Age of Onset  . Diabetes Mother   . Prostate cancer Father     Social History Social History   Tobacco Use  . Smoking status: Current Every Day Smoker    Packs/day: 1.00    Types: Cigarettes  . Smokeless tobacco: Never Used  Substance Use Topics  . Alcohol use: No  . Drug use: No     Review of Systems  Gastrointestinal:  No nausea, no vomiting.  Musculoskeletal: Positive for finger pain.  Skin: Negative for rash,  ecchymosis.  Positive for laceration. Neurological: Negative for numbness or tingling   ____________________________________________   PHYSICAL EXAM:  VITAL SIGNS: ED Triage Vitals  Enc Vitals Group     BP 08/09/17 1626 132/71     Pulse Rate 08/09/17 1626 74     Resp 08/09/17 1626 20     Temp 08/09/17 1626 97.9 F (36.6 C)     Temp Source 08/09/17 1626 Oral     SpO2 08/09/17 1626 96 %     Weight 08/09/17 1618 214 lb (97.1 kg)     Height 08/09/17 1618 6\' 4"  (1.93 m)     Head Circumference --      Peak Flow --      Pain Score 08/09/17 1618 8     Pain Loc --      Pain Edu? --      Excl. in [place]? --      Constitutional: Alert and oriented. Well appearing and in no acute distress. Eyes: Conjunctivae are normal. PERRL. EOMI. Head: Atraumatic. ENT:      Ears:      Nose: No congestion/rhinnorhea.      Mouth/Throat: Mucous membranes are moist.  Neck: No stridor.  Cardiovascular:Good peripheral circulation. Respiratory: Normal respiratory effort without tachypnea or retractions.  Musculoskeletal: Full range of motion to all extremities. No gross deformities appreciated.  Able to perform resisted flexion and extension of finger. Neurologic:  Normal speech and language. No gross focal neurologic deficits are appreciated.  Skin:  Skin is warm, dry.  1 cm laceration to proximal right fourth finger.   ____________________________________________   LABS (all labs ordered are listed, but only abnormal results are displayed)  Labs Reviewed - No data to display ____________________________________________  EKG   ____________________________________________  RADIOLOGY  No results found.  ____________________________________________    PROCEDURES  Procedure(s) performed:    Procedures  LACERATION REPAIR Performed by: Laban Emperor  Consent: Verbal consent obtained.  Consent given by: patient  Prepped and Draped in normal sterile fashion  Wound explored: No  foreign bodies   Laceration Location: 4th finger  Laceration Length: 1 cm  Anesthesia: None  Local anesthetic: lidocaine 1% without epinephrine  Anesthetic total: 4 ml  Irrigation method: syringe  Amount of cleaning: 544ml normal saline  Skin closure: 4-0 nylon  Number of sutures: 2  Technique: Simple interrupted  Patient tolerance: Patient tolerated the procedure well with no immediate complications.  Medications  lidocaine (PF) (XYLOCAINE) 1 % injection 5 mL (has no administration in time range)  Tdap (BOOSTRIX) injection 0.5 mL (0.5 mLs Intramuscular Given 08/09/17 1730)     ____________________________________________   INITIAL IMPRESSION / ASSESSMENT AND PLAN / ED COURSE  Pertinent labs & imaging results that were available during my care of the patient were reviewed by me and considered in my medical decision making (see chart for details).  Review of  the  [place]RS was performed in accordance of the [place] prior to dispensing any controlled drugs.     Patient's diagnosis is consistent with finger laceration.  Vital signs and exam are reassuring.  Laceration was repaired with stitches.  Wound was dressed.  Tetanus shot was updated.  Patient will be discharged home with prescriptions for clindamycin. Patient is to follow up with PCP as directed. Patient is given ED precautions to return to the ED for any worsening or new symptoms.     ____________________________________________  FINAL CLINICAL IMPRESSION(S) / ED DIAGNOSES  Final diagnoses:  Laceration of right ring finger without foreign body without damage to nail, initial encounter      NEW MEDICATIONS STARTED DURING THIS VISIT:  ED Discharge Orders        Ordered    clindamycin (CLEOCIN) 300 MG capsule  3 times daily     08/09/17 1732          This chart was dictated using voice recognition software/Dragon. Despite best efforts to proofread, errors can occur which can change the meaning. Any  change was purely unintentional.    Laban Emperor, PA-C 08/09/17 1946    Hinda Kehr, MD 08/09/17 [id number]

## 2017-08-09 NOTE — ED Triage Notes (Signed)
Presents with laceration to right 4 th digit  States  He cut it by piece of steel

## 2017-10-07 ENCOUNTER — Emergency Department: Payer: Self-pay

## 2017-10-07 ENCOUNTER — Other Ambulatory Visit: Payer: Self-pay

## 2017-10-07 ENCOUNTER — Encounter: Payer: Self-pay | Admitting: Emergency Medicine

## 2017-10-07 ENCOUNTER — Emergency Department
Admission: EM | Admit: 2017-10-07 | Discharge: 2017-10-07 | Disposition: A | Discharge status: Home / Self Care | Payer: Self-pay | Attending: Emergency Medicine | Admitting: Emergency Medicine

## 2017-10-07 DIAGNOSIS — Z79899 Other long term (current) drug therapy: Secondary | ICD-10-CM | POA: Insufficient documentation

## 2017-10-07 DIAGNOSIS — K529 Noninfective gastroenteritis and colitis, unspecified: Secondary | ICD-10-CM | POA: Insufficient documentation

## 2017-10-07 DIAGNOSIS — F17210 Nicotine dependence, cigarettes, uncomplicated: Secondary | ICD-10-CM | POA: Insufficient documentation

## 2017-10-07 LAB — CBC
HCT: 42.6 % (ref 40.0–52.0)
HEMOGLOBIN: 14.8 g/dL (ref 13.0–18.0)
MCH: 32.2 pg (ref 26.0–34.0)
MCHC: 34.8 g/dL (ref 32.0–36.0)
MCV: 92.3 fL (ref 80.0–100.0)
Platelets: 175 10*3/uL (ref 150–440)
RBC: 4.62 MIL/uL (ref 4.40–5.90)
RDW: 14.2 % (ref 11.5–14.5)
WBC: 9 10*3/uL (ref 3.8–10.6)

## 2017-10-07 LAB — URINALYSIS, COMPLETE (UACMP) WITH MICROSCOPIC
BACTERIA UA: NONE SEEN
Bilirubin Urine: NEGATIVE
GLUCOSE, UA: NEGATIVE mg/dL
Hgb urine dipstick: NEGATIVE
KETONES UR: NEGATIVE mg/dL
Leukocytes, UA: NEGATIVE
Nitrite: NEGATIVE
PROTEIN: NEGATIVE mg/dL
SQUAMOUS EPITHELIAL / LPF: NONE SEEN (ref 0–5)
Specific Gravity, Urine: 1.023 (ref 1.005–1.030)
pH: 6 (ref 5.0–8.0)

## 2017-10-07 LAB — COMPREHENSIVE METABOLIC PANEL
ALBUMIN: 3.9 g/dL (ref 3.5–5.0)
ALK PHOS: 70 U/L (ref 38–126)
ALT: 25 U/L (ref 17–63)
ANION GAP: 6 (ref 5–15)
AST: 27 U/L (ref 15–41)
BUN: 16 mg/dL (ref 6–20)
CALCIUM: 9.2 mg/dL (ref 8.9–10.3)
CO2: 27 mmol/L (ref 22–32)
Chloride: 106 mmol/L (ref 101–111)
Creatinine, Ser: 1.04 mg/dL (ref 0.61–1.24)
GFR calc Af Amer: >60 mL/min (ref >60)
GFR calc non Af Amer: >60 mL/min (ref >60)
Glucose, Bld: 95 mg/dL (ref 65–99)
Potassium: 3.6 mmol/L (ref 3.5–5.1)
SODIUM: 139 mmol/L (ref 135–145)
Total Bilirubin: 0.4 mg/dL (ref 0.3–1.2)
Total Protein: 7.2 g/dL (ref 6.5–8.1)

## 2017-10-07 LAB — LIPASE, BLOOD: LIPASE: 25 U/L (ref 11–51)

## 2017-10-07 LAB — TROPONIN I: Troponin I: <0.03 ng/mL (ref <0.03)

## 2017-10-07 MED ORDER — CIPROFLOXACIN HCL 500 MG PO TABS: 500.0000 mg | ORAL_TABLET | Freq: Two times a day (BID) | ORAL | 0 refills | Status: AC

## 2017-10-07 MED ORDER — CIPROFLOXACIN HCL 500 MG PO TABS
500.0000 mg | ORAL_TABLET | Freq: Once | ORAL | Status: AC
  Administered 2017-10-07: 500 mg via ORAL
  Filled 2017-10-07: qty 1

## 2017-10-07 MED ORDER — METRONIDAZOLE 500 MG PO TABS
500.0000 mg | ORAL_TABLET | Freq: Once | ORAL | Status: AC
  Administered 2017-10-07: 500 mg via ORAL
  Filled 2017-10-07: qty 1

## 2017-10-07 MED ORDER — MORPHINE SULFATE (PF) 4 MG/ML IV SOLN
4.0000 mg | Freq: Once | INTRAVENOUS | Status: AC
  Administered 2017-10-07: 4 mg via INTRAVENOUS
  Filled 2017-10-07: qty 1

## 2017-10-07 MED ORDER — ONDANSETRON HCL 4 MG/2ML IJ SOLN
4.0000 mg | Freq: Once | INTRAMUSCULAR | Status: AC
  Administered 2017-10-07: 4 mg via INTRAVENOUS
  Filled 2017-10-07: qty 2

## 2017-10-07 MED ORDER — IOPAMIDOL (ISOVUE-300) INJECTION 61%
100.0000 mL | Freq: Once | INTRAVENOUS | Status: AC | PRN
  Administered 2017-10-07: 100 mL via INTRAVENOUS

## 2017-10-07 MED ORDER — METRONIDAZOLE 500 MG PO TABS: 500.0000 mg | ORAL_TABLET | Freq: Two times a day (BID) | ORAL | 0 refills | Status: AC

## 2017-10-07 MED ORDER — HYDROCODONE-ACETAMINOPHEN 5-325 MG PO TABS: 1.0000 | ORAL_TABLET | Freq: Four times a day (QID) | ORAL | 0 refills | Status: AC | PRN

## 2017-10-07 NOTE — Discharge Instructions (Signed)
Please take both of your antibiotics as prescribed and make an appointment to follow-up with the GI doctor within a week or 2 for reevaluation.  Return to the emergency department sooner for any concerns whatsoever.  It was a pleasure to take care of you today, and thank you for coming to our emergency department.  If you have any questions or concerns before leaving please ask the nurse to grab me and I'm more than happy to go through your aftercare instructions again.  If you were prescribed any opioid pain medication today such as Norco, Vicodin, Percocet, morphine, hydrocodone, or oxycodone please make sure you do not drive when you are taking this medication as it can alter your ability to drive safely.  If you have any concerns once you are home that you are not improving or are in fact getting worse before you can make it to your follow-up appointment, please do not hesitate to call 911 and come back for further evaluation.  Darel Hong, MD  Results for orders placed or performed during the hospital encounter of 10/07/17  Comprehensive metabolic panel  Result Value Ref Range   Sodium 139 135 - 145 mmol/L   Potassium 3.6 3.5 - 5.1 mmol/L   Chloride 106 101 - 111 mmol/L   CO2 27 22 - 32 mmol/L   Glucose, Bld 95 65 - 99 mg/dL   BUN 16 6 - 20 mg/dL   Creatinine, Ser 1.04 0.61 - 1.24 mg/dL   Calcium 9.2 8.9 - 10.3 mg/dL   Total Protein 7.2 6.5 - 8.1 g/dL   Albumin 3.9 3.5 - 5.0 g/dL   AST 27 15 - 41 U/L   ALT 25 17 - 63 U/L   Alkaline Phosphatase 70 38 - 126 U/L   Total Bilirubin 0.4 0.3 - 1.2 mg/dL   GFR calc non Af Amer >60 >60 mL/min   GFR calc Af Amer >60 >60 mL/min   Anion gap 6 5 - 15  CBC  Result Value Ref Range   WBC 9.0 3.8 - 10.6 K/uL   RBC 4.62 4.40 - 5.90 MIL/uL   Hemoglobin 14.8 13.0 - 18.0 g/dL   HCT 42.6 40.0 - 52.0 %   MCV 92.3 80.0 - 100.0 fL   MCH 32.2 26.0 - 34.0 pg   MCHC 34.8 32.0 - 36.0 g/dL   RDW 14.2 11.5 - 14.5 %   Platelets 175 150 - 440 K/uL    Urinalysis, Complete w Microscopic  Result Value Ref Range   Color, Urine YELLOW (A) YELLOW   APPearance CLEAR (A) CLEAR   Specific Gravity, Urine 1.023 1.005 - 1.030   pH 6.0 5.0 - 8.0   Glucose, UA NEGATIVE NEGATIVE mg/dL   Hgb urine dipstick NEGATIVE NEGATIVE   Bilirubin Urine NEGATIVE NEGATIVE   Ketones, ur NEGATIVE NEGATIVE mg/dL   Protein, ur NEGATIVE NEGATIVE mg/dL   Nitrite NEGATIVE NEGATIVE   Leukocytes, UA NEGATIVE NEGATIVE   RBC / HPF 0-5 0 - 5 RBC/hpf   WBC, UA 0-5 0 - 5 WBC/hpf   Bacteria, UA NONE SEEN NONE SEEN   Squamous Epithelial / LPF NONE SEEN 0 - 5   Mucus PRESENT   Lipase, blood  Result Value Ref Range   Lipase 25 11 - 51 U/L  Troponin I  Result Value Ref Range   Troponin I <0.03 <0.03 ng/mL  Type and screen [place]  Result Value Ref Range   ABO/RH(D) PENDING    Antibody Screen  PENDING    Sample Expiration      10/10/2017 Performed at [facility], [address] [place], [place] [address]    Ct Abdomen Pelvis W Contrast  Result Date: 10/07/2017 CLINICAL DATA:  Lower abdominal pain with rectal bleeding. Abdominal infection. EXAM: CT ABDOMEN AND PELVIS WITH CONTRAST TECHNIQUE: Multidetector CT imaging of the abdomen and pelvis was performed using the standard protocol following bolus administration of intravenous contrast. CONTRAST:  167mL ISOVUE-300 IOPAMIDOL (ISOVUE-300) INJECTION 61% COMPARISON:  CT 08/05/2017 FINDINGS: Lower chest: Mild hypoventilatory change at the lung bases. No pleural fluid. No consolidation. Hepatobiliary: No focal hepatic lesion. Gallbladder nondistended. No calcified gallstone. No biliary dilatation. Pancreas: No ductal dilatation or inflammation. Spleen: Normal in size without focal abnormality. Adrenals/Urinary Tract: Normal adrenal glands. No hydronephrosis or perinephric edema. Homogeneous renal enhancement with symmetric excretion on delayed phase imaging. Left parapelvic cysts again seen.  Urinary bladder is completely decompressed. Stomach/Bowel: Stomach physiologically distended. Small duodenum diverticulum. No small bowel wall thickening, inflammatory change or obstruction. Prior appendectomy. Nondistention versus wall thickening of the hepatic flexure and descending colon. No pericolonic inflammatory change. There is sigmoid colonic redundancy. Vascular/Lymphatic: Aorto bi-iliac atherosclerosis without aneurysm. No enlarged abdominal or pelvic lymph nodes. Reproductive: Prostate is unremarkable. No free air, free fluid, or intra-abdominal fluid collection. Other: No free air, free fluid, or intra-abdominal fluid collection. Musculoskeletal: There are no acute or suspicious osseous abnormalities. IMPRESSION: 1. Equivocal colonic wall thickening versus nondistention of the hepatic flexure and descending colon. This may reflect colitis in the setting of rectal bleeding. 2. No other acute abnormality. 3.  Aortic Atherosclerosis (ICD10-I70.0). Electronically Signed   By: Jeb Levering M.D.   On: 10/07/2017 02:49

## 2017-10-07 NOTE — ED Triage Notes (Signed)
Pt to triage via w/c, appears uncomfortable; st having lower abd pain tonight with rectal bleeding; denies hx of same

## 2017-10-07 NOTE — ED Provider Notes (Signed)
[facility] Emergency Department Provider Note  ____________________________________________   First MD Initiated Contact with Patient 10/07/17 0231     (approximate)  I have reviewed the triage vital signs and the nursing notes.   HISTORY  Chief Complaint Abdominal Pain   HPI Carlos Holland is a 52 y.o. male who self presents to the emergency department with 1 day of moderate to severe intermittent cramping lower abdominal discomfort associated with nausea several loose stools and hematochezia.  He has a past medical history of hepatitis C and a past surgical history of an appendectomy.  He is had no history of GI bleed.  He denies NSAID use.  His symptoms came on gradually have been intermittent moderate to severe pain is nonradiating.  Nothing seems to make it better or worse.  Past Medical History:  Diagnosis Date  . Hepatitis    hepatitis c  . History of kidney stones 01/2016    There are no active problems to display for this patient.   Past Surgical History:  Procedure Laterality Date  . APPENDECTOMY    . CYSTOSCOPY W/ RETROGRADES  05/21/2016   Procedure: CYSTOSCOPY WITH RETROGRADE PYELOGRAM;  Surgeon: Festus Aloe, MD;  Location: [facility] ORS;  Service: Urology;;  . KNEE SURGERY Left   . TRANSURETHRAL RESECTION OF BLADDER TUMOR N/A 05/21/2016   Procedure: TRANSURETHRAL RESECTION OF BLADDER TUMOR (TURBT);  Surgeon: Festus Aloe, MD;  Location: [facility] ORS;  Service: Urology;  Laterality: N/A;    Prior to Admission medications   Medication Sig Start Date End Date Taking? Authorizing Provider  albuterol (PROVENTIL HFA;VENTOLIN HFA) 108 (90 Base) MCG/ACT inhaler Inhale 2 puffs into the lungs every 6 (six) hours as needed for wheezing or shortness of breath. Patient not taking: Reported on 09/13/2016 09/02/16   Laban Emperor, PA-C  azithromycin (ZITHROMAX Z-PAK) 250 MG tablet Take 2 tablets (500 mg) on  Day 1,  followed by 1 tablet (250 mg) once  daily on Days 2 through 5. Patient not taking: Reported on 09/13/2016 09/02/16   Laban Emperor, PA-C  ciprofloxacin (CIPRO) 500 MG tablet Take 1 tablet (500 mg total) by mouth 2 (two) times daily for 10 days. 10/07/17 10/17/17  Darel Hong, MD  cyclobenzaprine (FLEXERIL) 10 MG tablet Take 1 tablet (10 mg total) by mouth 3 (three) times daily as needed for muscle spasms. 06/19/17   Triplett, Cari B, FNP  guaiFENesin-codeine 100-10 MG/5ML syrup Take 10 mLs by mouth 3 (three) times daily as needed. Patient not taking: Reported on 09/13/2016 08/23/16   Sherrie George B, FNP  HYDROcodone-acetaminophen (NORCO) 5-325 MG tablet Take 1 tablet by mouth every 6 (six) hours as needed for up to 7 doses for severe pain. 10/07/17   Darel Hong, MD  ibuprofen (ADVIL,MOTRIN) 800 MG tablet Take 1 tablet (800 mg total) by mouth every 8 (eight) hours as needed for moderate pain. 10/15/16   Paulette Blanch, MD  ketorolac (ACULAR) 0.5 % ophthalmic solution Place 1 drop into the right eye 4 (four) times daily. 05/25/17   Triplett, [name], FNP  meloxicam (MOBIC) 15 MG tablet Take 1 tablet (15 mg total) by mouth daily. 06/19/17   Triplett, [name], FNP  metroNIDAZOLE (FLAGYL) 500 MG tablet Take 1 tablet (500 mg total) by mouth 2 (two) times daily for 10 days. 10/07/17 10/17/17  Darel Hong, MD  nitrofurantoin (MACRODANTIN) 100 MG capsule Take 1 capsule (100 mg total) by mouth at bedtime. Patient not taking: Reported on 06/04/2016 05/21/16  Festus Aloe, MD  oxyCODONE-acetaminophen (PERCOCET) 7.5-325 MG tablet Take 1 tablet by mouth every 4 (four) hours as needed for up to 8 doses for severe pain. 08/05/17   Gregor Hams, MD  polyethylene glycol [facility]) packet Take 17 g by mouth daily as needed (constipation). 09/13/16   [name], Randall An, MD  predniSONE (STERAPRED UNI-PAK 21 TAB) 10 MG (21) TBPK tablet Take 6 tablets on day 1, take 5 tablets on day 2, take 4 tablets on day 3, take 3 tablets on day 4, take 2 tablets on day  5, take 1 tablet on day 6 Patient not taking: Reported on 09/13/2016 09/02/16   Laban Emperor, PA-C    Allergies Bee venom; Penicillins; and Tramadol  Family History  Problem Relation Age of Onset  . Diabetes Mother   . Prostate cancer Father     Social History Social History   Tobacco Use  . Smoking status: Current Every Day Smoker    Packs/day: 1.00    Types: Cigarettes  . Smokeless tobacco: Never Used  Substance Use Topics  . Alcohol use: No  . Drug use: No    Review of Systems Constitutional: No fever/chills Eyes: No visual changes. ENT: No sore throat. Cardiovascular: Denies chest pain. Respiratory: Denies shortness of breath. Gastrointestinal: Positive for abdominal pain.  Positive for nausea, no vomiting.  Positive for diarrhea.  No constipation. Genitourinary: Negative for dysuria. Musculoskeletal: Negative for back pain. Skin: Negative for rash. Neurological: Negative for headaches, focal weakness or numbness.   ____________________________________________   PHYSICAL EXAM:  VITAL SIGNS: ED Triage Vitals  Enc Vitals Group     BP 10/07/17 0032 (!) 162/94     Pulse Rate 10/07/17 0032 70     Resp 10/07/17 0032 (!) 22     Temp 10/07/17 0032 97.8 F (36.6 C)     Temp Source 10/07/17 0032 Oral     SpO2 10/07/17 0032 98 %     Weight 10/07/17 0032 218 lb (98.9 kg)     Height 10/07/17 0032 6\' 4"  (1.93 m)     Head Circumference --      Peak Flow --      Pain Score 10/07/17 0052 9     Pain Loc --      Pain Edu? --      Excl. in [place]? --     Constitutional: Alert and oriented x4 appears uncomfortable nontoxic no diaphoresis speaks in full clear sentences Eyes: PERRL EOMI. Head: Atraumatic. Nose: No congestion/rhinnorhea. Mouth/Throat: No trismus Neck: No stridor.   Cardiovascular: Normal rate, regular rhythm. Grossly normal heart sounds.  Good peripheral circulation. Respiratory: Increased respiratory effort.  No retractions. Lungs CTAB and moving good  air Gastrointestinal: Soft abdomen diffuse lower tenderness with some guarding but no frank peritonitis Musculoskeletal: No lower extremity edema   Neurologic:  Normal speech and language. No gross focal neurologic deficits are appreciated. Skin:  Skin is warm, dry and intact. No rash noted. Psychiatric: Mood and affect are normal. Speech and behavior are normal.    ____________________________________________   DIFFERENTIAL includes but not limited to  Diverticulitis, stump appendicitis, bowel obstruction, upper GI bleed, lower GI bleed ____________________________________________   LABS (all labs ordered are listed, but only abnormal results are displayed)  Labs Reviewed  URINALYSIS, COMPLETE (UACMP) WITH MICROSCOPIC - Abnormal; Notable for the following components:      Result Value   Color, Urine YELLOW (*)    APPearance CLEAR (*)    All other components  within normal limits  COMPREHENSIVE METABOLIC PANEL  CBC  LIPASE, BLOOD  TROPONIN I    Lab work reviewed by me with no acute disease __________________________________________  EKG   ____________________________________________  RADIOLOGY  CT abdomen pelvis reviewed by me consistent with possible colitis ____________________________________________   PROCEDURES  Procedure(s) performed: no  Procedures  Critical Care performed: no  Observation: no ____________________________________________   INITIAL IMPRESSION / ASSESSMENT AND PLAN / ED COURSE  Pertinent labs & imaging results that were available during my care of the patient were reviewed by me and considered in my medical decision making (see chart for details).  Patient arrives uncomfortable appearing with abdominal pain diarrhea and GI bleed.  Differential is broad but this point I do believe she requires blood work and a CT scan with IV contrast to evaluate for infectious versus surgical etiology of the symptoms.     After IV pain medication  the patient's symptoms are improved.  His CT scan is concerning for colitis.  I do lengthy discussion with the patient regarding the diagnostic uncertainty and that we could presume this was infectious however he would need to be evaluated by gastroenterology as an outpatient after a course of antibiotics to fully evaluate for Crohn's versus ulcerative colitis etc.  He verbalizes understanding and agreement the plan.  He is medically stable for outpatient management.  ____________________________________________   FINAL CLINICAL IMPRESSION(S) / ED DIAGNOSES  Final diagnoses:  Colitis      NEW MEDICATIONS STARTED DURING THIS VISIT:  Discharge Medication List as of 10/07/2017  3:58 AM    START taking these medications   Details  ciprofloxacin (CIPRO) 500 MG tablet Take 1 tablet (500 mg total) by mouth 2 (two) times daily for 10 days., Starting Fri 10/07/2017, Until Mon 10/17/2017, Print    HYDROcodone-acetaminophen (NORCO) 5-325 MG tablet Take 1 tablet by mouth every 6 (six) hours as needed for up to 7 doses for severe pain., Starting Fri 10/07/2017, Print    metroNIDAZOLE (FLAGYL) 500 MG tablet Take 1 tablet (500 mg total) by mouth 2 (two) times daily for 10 days., Starting Fri 10/07/2017, Until Mon 10/17/2017, Print         Note:  This document was prepared using Dragon voice recognition software and may include unintentional dictation errors.     Darel Hong, MD 10/10/17 1342

## 2017-10-26 ENCOUNTER — Encounter: Payer: Self-pay | Admitting: Gastroenterology

## 2017-10-26 ENCOUNTER — Ambulatory Visit: Payer: Self-pay | Admitting: Gastroenterology

## 2017-10-26 ENCOUNTER — Other Ambulatory Visit: Payer: Self-pay
